# Patient Record
Sex: Female | Born: 1965
Health system: Southern US, Community
[De-identification: ages and names within clinical notes are randomized; demographics above are authoritative.]

## PROBLEM LIST (undated history)

## (undated) DIAGNOSIS — I517 Cardiomegaly: Secondary | ICD-10-CM

## (undated) DIAGNOSIS — Z9289 Personal history of other medical treatment: Secondary | ICD-10-CM

## (undated) DIAGNOSIS — I10 Essential (primary) hypertension: Secondary | ICD-10-CM

## (undated) DIAGNOSIS — J398 Other specified diseases of upper respiratory tract: Secondary | ICD-10-CM

## (undated) DIAGNOSIS — D649 Anemia, unspecified: Secondary | ICD-10-CM

## (undated) HISTORY — DX: Cardiomegaly: I51.7

## (undated) HISTORY — PX: TRACHEOSTOMY: SUR1362

## (undated) HISTORY — DX: Personal history of other medical treatment: Z92.89

## (undated) HISTORY — DX: Essential (primary) hypertension: I10

## (undated) HISTORY — DX: Anemia, unspecified: D64.9

## (undated) HISTORY — DX: Other specified diseases of upper respiratory tract: J39.8

## (undated) HISTORY — DX: Morbid (severe) obesity due to excess calories: E66.01

---

## 2005-10-27 ENCOUNTER — Other Ambulatory Visit: Admission: RE | Admit: 2005-10-27 | Discharge: 2005-10-27 | Payer: Self-pay | Admitting: Family Medicine

## 2006-03-14 ENCOUNTER — Emergency Department (HOSPITAL_COMMUNITY): Admission: EM | Admit: 2006-03-14 | Discharge: 2006-03-15 | Payer: Self-pay | Admitting: Emergency Medicine

## 2006-03-16 ENCOUNTER — Ambulatory Visit (HOSPITAL_COMMUNITY): Admission: RE | Admit: 2006-03-16 | Discharge: 2006-03-16 | Payer: Self-pay | Admitting: Emergency Medicine

## 2007-07-02 LAB — HM COLONOSCOPY: HM Colonoscopy: NORMAL

## 2008-06-30 LAB — CONVERTED CEMR LAB: Pap Smear: NORMAL

## 2009-11-17 LAB — CONVERTED CEMR LAB: Pap Smear: NORMAL

## 2009-11-18 ENCOUNTER — Emergency Department (HOSPITAL_COMMUNITY): Admission: EM | Admit: 2009-11-18 | Discharge: 2009-11-19 | Payer: Self-pay | Admitting: Emergency Medicine

## 2009-11-19 ENCOUNTER — Encounter (INDEPENDENT_AMBULATORY_CARE_PROVIDER_SITE_OTHER): Payer: Self-pay | Admitting: *Deleted

## 2009-11-25 ENCOUNTER — Encounter (INDEPENDENT_AMBULATORY_CARE_PROVIDER_SITE_OTHER): Payer: Self-pay | Admitting: *Deleted

## 2009-11-25 ENCOUNTER — Emergency Department (HOSPITAL_COMMUNITY): Admission: EM | Admit: 2009-11-25 | Discharge: 2009-11-25 | Payer: Self-pay | Admitting: Emergency Medicine

## 2009-11-29 ENCOUNTER — Ambulatory Visit: Payer: Self-pay | Admitting: Family

## 2009-11-29 DIAGNOSIS — I10 Essential (primary) hypertension: Secondary | ICD-10-CM | POA: Insufficient documentation

## 2009-11-29 DIAGNOSIS — M545 Low back pain, unspecified: Secondary | ICD-10-CM | POA: Insufficient documentation

## 2009-11-29 LAB — CONVERTED CEMR LAB
CO2: 27 meq/L (ref 19–32)
Calcium: 9.7 mg/dL (ref 8.4–10.5)
Cholesterol: 135 mg/dL (ref 0–200)
Creatinine, Ser: 1.01 mg/dL (ref 0.40–1.20)
HCT: 37.4 % (ref 36.0–46.0)
MCHC: 31.6 g/dL (ref 30.0–36.0)
Platelets: 343 10*3/uL (ref 150–400)
RBC: 5.18 M/uL — ABNORMAL HIGH (ref 3.87–5.11)
Sodium: 141 meq/L (ref 135–145)
Total CHOL/HDL Ratio: 3.1
VLDL: 17 mg/dL (ref 0–40)

## 2009-11-30 ENCOUNTER — Telehealth: Payer: Self-pay | Admitting: Family

## 2009-11-30 ENCOUNTER — Telehealth (INDEPENDENT_AMBULATORY_CARE_PROVIDER_SITE_OTHER): Payer: Self-pay | Admitting: *Deleted

## 2009-11-30 ENCOUNTER — Encounter: Payer: Self-pay | Admitting: Family

## 2009-11-30 DIAGNOSIS — D508 Other iron deficiency anemias: Secondary | ICD-10-CM | POA: Insufficient documentation

## 2009-12-01 ENCOUNTER — Encounter (INDEPENDENT_AMBULATORY_CARE_PROVIDER_SITE_OTHER): Payer: Self-pay | Admitting: *Deleted

## 2009-12-07 ENCOUNTER — Ambulatory Visit (HOSPITAL_BASED_OUTPATIENT_CLINIC_OR_DEPARTMENT_OTHER): Admission: RE | Admit: 2009-12-07 | Discharge: 2009-12-07 | Payer: Self-pay | Admitting: Internal Medicine

## 2009-12-07 ENCOUNTER — Ambulatory Visit: Payer: Self-pay | Admitting: Radiology

## 2009-12-09 ENCOUNTER — Ambulatory Visit: Payer: Self-pay | Admitting: Family

## 2009-12-12 ENCOUNTER — Telehealth (INDEPENDENT_AMBULATORY_CARE_PROVIDER_SITE_OTHER): Payer: Self-pay | Admitting: *Deleted

## 2009-12-14 ENCOUNTER — Encounter: Admission: RE | Admit: 2009-12-14 | Discharge: 2009-12-17 | Payer: Self-pay | Admitting: Internal Medicine

## 2009-12-14 ENCOUNTER — Encounter (INDEPENDENT_AMBULATORY_CARE_PROVIDER_SITE_OTHER): Payer: Self-pay | Admitting: *Deleted

## 2009-12-16 ENCOUNTER — Ambulatory Visit: Payer: Self-pay | Admitting: Family

## 2009-12-17 ENCOUNTER — Encounter: Payer: Self-pay | Admitting: Family

## 2009-12-20 ENCOUNTER — Encounter: Admission: RE | Admit: 2009-12-20 | Discharge: 2010-01-07 | Payer: Self-pay | Admitting: Internal Medicine

## 2009-12-24 ENCOUNTER — Ambulatory Visit: Payer: Self-pay | Admitting: Family

## 2010-01-11 ENCOUNTER — Ambulatory Visit: Payer: Self-pay | Admitting: Family

## 2010-01-26 ENCOUNTER — Ambulatory Visit: Payer: Self-pay | Admitting: Family

## 2010-01-26 ENCOUNTER — Telehealth: Payer: Self-pay | Admitting: Family

## 2010-01-26 LAB — CONVERTED CEMR LAB
BUN: 12 mg/dL (ref 6–23)
Basophils Absolute: 0 10*3/uL (ref 0.0–0.1)
CO2: 28 meq/L (ref 19–32)
Calcium: 8.7 mg/dL (ref 8.4–10.5)
Creatinine, Ser: 0.9 mg/dL (ref 0.4–1.2)
Glucose, Bld: 98 mg/dL (ref 70–99)
Hemoglobin: 9.8 g/dL — ABNORMAL LOW (ref 12.0–15.0)
Monocytes Absolute: 0.5 10*3/uL (ref 0.1–1.0)
Monocytes Relative: 5.9 % (ref 3.0–12.0)
Neutro Abs: 6.9 10*3/uL (ref 1.4–7.7)
Potassium: 3.5 meq/L (ref 3.5–5.1)
RDW: 18 % — ABNORMAL HIGH (ref 11.5–14.6)

## 2010-01-30 ENCOUNTER — Encounter: Payer: Self-pay | Admitting: Family

## 2010-01-31 ENCOUNTER — Encounter (INDEPENDENT_AMBULATORY_CARE_PROVIDER_SITE_OTHER): Payer: Self-pay | Admitting: *Deleted

## 2010-02-07 ENCOUNTER — Encounter: Payer: Self-pay | Admitting: Internal Medicine

## 2010-02-09 ENCOUNTER — Ambulatory Visit: Payer: Self-pay | Admitting: Family

## 2010-02-09 LAB — CONVERTED CEMR LAB
Basophils Absolute: 0.1 10*3/uL (ref 0.0–0.1)
Basophils Relative: 0.5 % (ref 0.0–3.0)
Eosinophils Absolute: 0.2 10*3/uL (ref 0.0–0.7)
HCT: 33.2 % — ABNORMAL LOW (ref 36.0–46.0)
Lymphs Abs: 1.8 10*3/uL (ref 0.7–4.0)
MCV: 74.6 fL — ABNORMAL LOW (ref 78.0–100.0)
Monocytes Absolute: 0.5 10*3/uL (ref 0.1–1.0)
Neutro Abs: 7.6 10*3/uL (ref 1.4–7.7)
Platelets: 258 10*3/uL (ref 150.0–400.0)
RBC: 4.45 M/uL (ref 3.87–5.11)
RDW: 18.2 % — ABNORMAL HIGH (ref 11.5–14.6)

## 2010-04-06 ENCOUNTER — Telehealth: Payer: Self-pay | Admitting: Family

## 2010-04-15 ENCOUNTER — Telehealth: Payer: Self-pay | Admitting: Family

## 2010-04-20 ENCOUNTER — Encounter: Payer: Self-pay | Admitting: Family

## 2010-04-20 ENCOUNTER — Telehealth: Payer: Self-pay | Admitting: Family

## 2010-07-27 ENCOUNTER — Ambulatory Visit: Payer: Self-pay | Admitting: Family Medicine

## 2010-07-28 LAB — CONVERTED CEMR LAB
BUN: 12 mg/dL (ref 6–23)
Basophils Relative: 0.4 % (ref 0.0–3.0)
Calcium: 9.3 mg/dL (ref 8.4–10.5)
Eosinophils Absolute: 0.2 10*3/uL (ref 0.0–0.7)
GFR calc non Af Amer: 77.09 mL/min (ref >60)
HCT: 35.2 % — ABNORMAL LOW (ref 36.0–46.0)
Lymphocytes Relative: 25.8 % (ref 12.0–46.0)
Lymphs Abs: 2.3 10*3/uL (ref 0.7–4.0)
MCV: 76.6 fL — ABNORMAL LOW (ref 78.0–100.0)
Monocytes Relative: 5.9 % (ref 3.0–12.0)
Platelets: 286 10*3/uL (ref 150.0–400.0)
RBC: 4.6 M/uL (ref 3.87–5.11)
Sodium: 144 meq/L (ref 135–145)

## 2010-08-17 ENCOUNTER — Ambulatory Visit: Payer: Self-pay | Admitting: Family Medicine

## 2010-11-30 ENCOUNTER — Ambulatory Visit: Payer: Self-pay | Admitting: Family Medicine

## 2010-11-30 ENCOUNTER — Encounter: Payer: Self-pay | Admitting: Family Medicine

## 2010-12-01 ENCOUNTER — Telehealth (INDEPENDENT_AMBULATORY_CARE_PROVIDER_SITE_OTHER): Payer: Self-pay | Admitting: *Deleted

## 2010-12-01 LAB — CONVERTED CEMR LAB
AST: 24 units/L (ref 0–37)
Albumin: 3.3 g/dL — ABNORMAL LOW (ref 3.5–5.2)
Basophils Absolute: 0 10*3/uL (ref 0.0–0.1)
Eosinophils Relative: 1 % (ref 0.0–5.0)
HCT: 33.5 % — ABNORMAL LOW (ref 36.0–46.0)
HIV: NONREACTIVE
Hemoglobin: 11 g/dL — ABNORMAL LOW (ref 12.0–15.0)
Lymphocytes Relative: 14.7 % (ref 12.0–46.0)
MCHC: 32.8 g/dL (ref 30.0–36.0)
Monocytes Relative: 4.8 % (ref 3.0–12.0)
Neutrophils Relative %: 79.2 % — ABNORMAL HIGH (ref 43.0–77.0)
Platelets: 255 10*3/uL (ref 150.0–400.0)
Potassium: 4 meq/L (ref 3.5–5.1)
RBC: 4.41 M/uL (ref 3.87–5.11)
RDW: 18.1 % — ABNORMAL HIGH (ref 11.5–14.6)
Total Bilirubin: 0.6 mg/dL (ref 0.3–1.2)
Total CHOL/HDL Ratio: 3
Total Protein: 6 g/dL (ref 6.0–8.3)
Vit D, 25-Hydroxy: 19 ng/mL — ABNORMAL LOW (ref 30–89)
WBC: 10.6 10*3/uL — ABNORMAL HIGH (ref 4.5–10.5)

## 2011-01-17 NOTE — Assessment & Plan Note (Signed)
Summary: 2wk. f/u - jr   Vital Signs:  Patient profile:   45 year old female Menstrual status:  regular Weight:      353.13 pounds BMI:     49.78 Pulse rate:   80 / minute Pulse rhythm:   regular Resp:     16 per minute BP sitting:   110 / 80  (left arm) Cuff size:   regular CC: room 17  2 week follow up on blood pressure and labs Is Patient Diabetic? No Comments Pt unsure why she was given rx for protonix last visit. Pt has only been taking iron supp. once daily.   CC:  room 17  2 week follow up on blood pressure and labs.  History of Present Illness: [name] presents today for follow up of her anemia and her blood pressure.  She was recently noted to have worsening anemia and was referred to [facility]- she has an upcoming appointment on 3/22 with GI.  She has a history of low back pain and was being treated with NSAIDS.   Due to concern that she may also be having a GI loss in addition to her chronic heavy menses her mobic was discontinued and she was prescribed protonix for GI prophylaxis.  Unfortunately she did not take protonix as prescribed.   Denies black stools, tarry stools or BRBPR. Does feel tired.  She has only been taking iron once daily, however she has previously been instructed to take 3x daily. Last period was 2/9 and lasted 7 days.  Notes that first few days are "really heavy"  Wears pads and super tampons- tampon often bleeds through to her pad.  She has seen GYN-  she is scheduled to see GYN on Monday and she is to have an ultrasound performed that day.    Preventive Screening-Counseling & Management  Alcohol-Tobacco     Smoking Status: never  Allergies (verified): No Known Drug Allergies  Physical Exam  General:  morbidly obese female in NAD Lungs:  Normal respiratory effort, chest expands symmetrically. Lungs are clear to auscultation, no crackles or wheezes. Heart:  Normal rate and regular rhythm. S1 and S2 normal without gallop, murmur, click, rub or  other extra sounds. Extremities:  No clubbing, cyanosis, edema, or deformity noted with normal full range of motion of all joints.     Impression & Recommendations:  Problem # 1:  ANEMIA, IRON DEFICIENCY, MICROCYTIC (ICD-280.8) Assessment Improved Hemoglobin is up from 9.8 to 10.3.  Plan for patient to continue iron on a three times a day schedule, f/u with GYN and GI as scheduled.   Her updated medication list for this problem includes:    Iron 325 (65 Fe) Mg Tabs (Ferrous sulfate) ..... One tablet by mouth three times a day prn  Orders: Venipuncture (09811) TLB-CBC Platelet - w/Differential (85025-CBCD)  Complete Medication List: 1)  Robaxin 500 Mg Tabs (Methocarbamol) .Marland Kitchen.. 1 tab by mouth hs 2)  Hydrochlorothiazide 25 Mg Tabs (Hydrochlorothiazide) .... 1/2  tablet by mouth daily 3)  Iron 325 (65 Fe) Mg Tabs (Ferrous sulfate) .... One tablet by mouth three times a day prn 4)  Labetalol Hcl 300 Mg Tabs (Labetalol hcl) .... One tab by mouth two times a day 5)  Protonix 40 Mg Tbec (Pantoprazole sodium) .... One tablet by mouth daily  Patient Instructions: 1)  Please take protonix daily and increase your iron to 3x daily. 2)  Keep appointments with GYN and GI. 3)  Call if increasing weakness,  bloody or black stools.    Current Allergies (reviewed today): No known allergies    Immunization History:  Influenza Immunization History:    Influenza:  historical (01/19/2009)    Preventive Care Screening  Mammogram:    Date:  11/17/2009    Results:  normal   Pap Smear:    Date:  11/17/2009    Results:  normal

## 2011-01-17 NOTE — Progress Notes (Signed)
Summary: labs-lmomx2  Phone Note Outgoing Call   Call placed by: Lemont Fillers FNP,  January 26, 2010 5:07 PM Summary of Call: Called patient, left message to call back regarding lab work.  When patient calls back please verify that she is taking iron supplement three times a day and that she has had an appointment with GYN to evaluate heavy menstrual bleeding.  She needs a follow up CBC in 1 month please (280.8). Initial call taken by: Lemont Fillers FNP,  January 26, 2010 5:09 PM  Follow-up for Phone Call        Called patient again, no answer.  Left message for patient to call back.  Also instructed patient to d/c mobic and switch to Tylenol.  I am concerned that her worsening anemia may be due to GI loss.  Will also refer to GI for further evaluation.    left message on machine ....Marland KitchenMarland KitchenDoristine Devoid  January 31, 2010 4:31 PM   left message on machine .Marland KitchenMarland KitchenDoristine Devoid  February 01, 2010 4:15 PM     Additional Follow-up for Phone Call Additional follow up Details #1::        Called patient.  Tells me that she has not had any dark or black colored stools.  + menses (heavy). Has seen GYN, they want to do an ultrasound, but this has been delayed due to menses.  I advised patient to discontinue mobic and use tylenol as needed.  Also advised patient to start protonix.  Will refer to GI.  Patient is agreeable, she has f/u with me on 2/23, will plan to repeat CBC at that time.  She reports taking iron two times a day, I recommended three times a day.  Additional Follow-up by: Lemont Fillers FNP,  February 01, 2010 5:51 PM

## 2011-01-17 NOTE — Letter (Signed)
Summary: New Patient letter  [facility]  [address] [place], [place] [address]   Phone: [phone]  Fax: [phone]       01/31/2010 MRN: [id number]  Margaret Barron [address] [address] [place], [place]  [address]  Dear Ms. Margaret Barron,  Welcome to the Gastroenterology Division at [facility].    You are scheduled to see Dr.  Christella Hartigan on 03-08-10 at 8:30a.m. on the 3rd floor at [facility], [address]  We ask that you try to arrive at our office 15 minutes prior to your appointment time to allow for check-in.  We would like you to complete the enclosed self-administered evaluation form prior to your visit and bring it with you on the day of your appointment.  We will review it with you.  Also, please bring a complete list of all your medications or, if you prefer, bring the medication bottles and we will list them.  Please bring your insurance card so that we may make a copy of it.  If your insurance requires a referral to see a specialist, please bring your referral form from your primary care physician.  Co-payments are due at the time of your visit and may be paid by cash, check or credit card.     Your office visit will consist of a consult with your physician (includes a physical exam), any laboratory testing he/she may order, scheduling of any necessary diagnostic testing (e.g. x-ray, ultrasound, CT-scan), and scheduling of a procedure (e.g. Endoscopy, Colonoscopy) if required.  Please allow enough time on your schedule to allow for any/all of these possibilities.    If you cannot keep your appointment, please call [phone] to cancel or reschedule prior to your appointment date.  This allows [place] the opportunity to schedule an appointment for another patient in need of care.  If you do not cancel or reschedule by 5 p.m. the business day prior to your appointment date, you will be charged a $50.00 late cancellation/no-show fee.    Thank you for choosing [facility] for your medical needs.  We appreciate the opportunity to care for you.  Please visit [place] at our website  to learn more about our practice.                     Sincerely,                                                             The Gastroenterology Division

## 2011-01-17 NOTE — Letter (Signed)
Summary: Generic Letter  [place] at [facility]  [address]   [place], [place] [address]   Phone: [phone]  Fax: [phone]    04/20/2010  [name] [address] [address] [place], [place]  [address]  Dear Mr [name],  Margaret Barron is a patient of mine who has a history of low back pain.  It would be helpful for her if accomodations could be made that would allow her to stand periodically during the day.    Sincerely,   Sandford Craze FNP  Appended Document: Generic Letter Letter mailed to pt.

## 2011-01-17 NOTE — Progress Notes (Signed)
Summary: request for adjustable height desk  Phone Note Call from Patient   Caller: Patient Call For: Margaret Fillers FNP Summary of Call: Pt states that even with the adjustable height chair she has to stand to relieve her back pain. When she stands she has to bend over her desk to work which creates more back pain.  She would like an adjustable height desk so she can still work when she has to stand. Mervin Kung CMA  April 06, 2010 5:38 PM   Follow-up for Phone Call        Per verbal conversation with Efraim Kaufmann, she will contact physical therapist for recommendations and we will let pt know of the outcome.  Mervin Kung CMA  April 06, 2010 5:41 PM   Left message on machine to return call.  Mervin Kung CMA  April 07, 2010 12:05 PM   Advised pt. Efraim Kaufmann is working on this and will consult with PT for recommendations. Advised pt. we will contact her with instruction or outcome.  Mervin Kung CMA  April 11, 2010 11:34 AM   Additional Follow-up for Phone Call Additional follow up Details #1::        Pls call patient and let her know that I spoke with [name] her PTA.  She has recommended a work place assessment to be done by a PT specialist.  They will come to her work and evaluate her equiptment needs and make recommendations.  Additional Follow-up by: Margaret Fillers FNP,  April 11, 2010 3:16 PM    f  Appended Document: request for adjustable height desk Spoke to pt. yesterday and explained that Efraim Kaufmann is referring her back to PT for work site evaluation to determine specific equipment to benefit her condition.  Pt voiced understanding and is agreeable. Notified Kisha at [facility] PT to contact pt again.

## 2011-01-17 NOTE — Assessment & Plan Note (Signed)
Summary: 2 week fu/ns/kdc   Vital Signs:  Patient profile:   45 year old female Menstrual status:  regular Weight:      349 pounds Pulse rate:   68 / minute BP sitting:   140 / 80  (left arm)  Vitals Entered By: Doristine Devoid (January 26, 2010 9:23 AM) CC: 2 week roa    CC:  2 week roa .  History of Present Illness: Margaret Barron is a 45 year old female who presents today for follow up of her HTN.  Last visit her Labetolol was increased for 100mg  by mouth two times a day to 200mg  by mouth two times a day.  Notes that she feels well on this dose.  She continues to take 12.5 mg of the HCTZ.    Allergies: No Known Drug Allergies  Review of Systems       denies swelling, headache or dizziness  Physical Exam  General:  morbidly obese female in NAD Neck:  former tracheostomy scar Lungs:  Normal respiratory effort, chest expands symmetrically. Lungs are clear to auscultation, no crackles or wheezes. Heart:  Normal rate and regular rhythm. S1 and S2 normal without gallop, murmur, click, rub or other extra sounds. Extremities:  no edema   Impression & Recommendations:  Problem # 1:  HYPERTENSION (ICD-401.9) Assessment Improved BP continues to improve,  will check BMET to assess electrolytes on HCTZ. Will increase Labetalol from 200mg  to 300mg  by mouth two times a day, plan f/u nurse visit for BP and HR check in 2 weeks.   Her updated medication list for this problem includes:    Hydrochlorothiazide 25 Mg Tabs (Hydrochlorothiazide) .Marland Kitchen... 1/2  tablet by mouth daily    Labetalol Hcl 300 Mg Tabs (Labetalol hcl) ..... One tab by mouth two times a day  BP today: 140/80 Prior BP: 150/90 (01/11/2010)  Labs Reviewed: K+: 4.2 (11/29/2009) Creat: : 1.01 (11/29/2009)   Chol: 135 (11/29/2009)   HDL: 43 (11/29/2009)   LDL: 75 (11/29/2009)   TG: 85 (11/29/2009)  Orders: Venipuncture (08657) TLB-BMP (Basic Metabolic Panel-BMET) (80048-METABOL)  Problem # 2:  LOW BACK PAIN, ACUTE  (ICD-724.2) Assessment: Improved Notes that overal pain is improving, Still requiring as needed robaxin and mobic and is requesting refill. Her updated medication list for this problem includes:    Robaxin 500 Mg Tabs (Methocarbamol) .Marland Kitchen... 1 tab by mouth hs    Mobic 7.5 Mg Tabs (Meloxicam) .Marland Kitchen..Marland Kitchen Two times a day by mouth daily  Problem # 3:  ANEMIA, IRON DEFICIENCY, MICROCYTIC (ICD-280.8) Assessment: Comment Only Has been on iron x 1 month.  Will check f/u CBC to evaluate improvement today. Her updated medication list for this problem includes:    Iron 325 (65 Fe) Mg Tabs (Ferrous sulfate) ..... One tablet by mouth three times a day prn  Orders: TLB-CBC Platelet - w/Differential (85025-CBCD)  Complete Medication List: 1)  Robaxin 500 Mg Tabs (Methocarbamol) .Marland Kitchen.. 1 tab by mouth hs 2)  Mobic 7.5 Mg Tabs (Meloxicam) .... Two times a day by mouth daily 3)  Hydrochlorothiazide 25 Mg Tabs (Hydrochlorothiazide) .... 1/2  tablet by mouth daily 4)  Iron 325 (65 Fe) Mg Tabs (Ferrous sulfate) .... One tablet by mouth three times a day prn 5)  Labetalol Hcl 300 Mg Tabs (Labetalol hcl) .... One tab by mouth two times a day  Patient Instructions: 1)  Please complete blood work prior to leaving today. 2)  Follow up in 2 weeks for a nurse visit to  have your heart rate and blood pressure checked. Prescriptions: ROBAXIN 500 MG TABS (METHOCARBAMOL) 1 tab by mouth hs  #30 x 1   Entered and Authorized by:   Lemont Fillers FNP   Signed by:   Lemont Fillers FNP on 01/26/2010   Method used:   Electronically to        [facility] [address] (retail)       [address]       [place], [place]  [address]       Ph: [phone]       Fax: [phone]   RxID:   [id number] MOBIC 7.5 MG TABS (MELOXICAM) two times a day by mouth daily  #30 x 1   Entered and Authorized by:   Lemont Fillers FNP   Signed by:   Lemont Fillers FNP on 01/26/2010   Method used:    Electronically to        [facility] [address] [facility] (retail)       [address]       [place], [place]  [address]       Ph: [phone]       Fax: [phone]   RxID:   [id number] LABETALOL HCL 300 MG TABS (LABETALOL HCL) one tab by mouth two times a day  #60 x 1   Entered and Authorized by:   Lemont Fillers FNP   Signed by:   Lemont Fillers FNP on 01/26/2010   Method used:   Electronically to        [facility] [address] (retail)       [address]       [place], [place]  [address]       Ph: [phone]       Fax: [phone]   RxID:   [id number]

## 2011-01-17 NOTE — Progress Notes (Signed)
Summary: [facility] refills--hctz, labetalol, robaxin  Phone Note Refill Request Message from:  Fax from Pharmacy [facility] on April 15, 2010 11:45 AM  Refills Requested: Medication #1:  HYDROCHLOROTHIAZIDE 25 MG TABS 1/2  tablet by mouth daily   Dosage confirmed as above?Dosage Confirmed   Supply Requested: 3 months  Medication #2:  LABETALOL HCL 300 MG TABS one tab by mouth two times a day   Dosage confirmed as above?Dosage Confirmed   Supply Requested: 3 months  Medication #3:  ROBAXIN 500 MG TABS 1 tab by mouth hs   Dosage confirmed as above?Dosage Confirmed   Supply Requested: 3 months Pt. last seen 03/08/10. Requesting 3 months supply on all meds. Meloxicam was removed from her med list previously. Please advise?  Initial call taken by: Mervin Kung CMA,  April 15, 2010 11:48 AM  Follow-up for Phone Call        OK to give 1 month supply of HCTZ, Labetalol, and robaxin.  No meloxicam- this was stopped due to anemia.  Please call pt and arrange f/u apt.  Also, please ask why she did not see GI.  This apt should be rescheduled also.  Thanks. Follow-up by: Lemont Fillers FNP,  April 15, 2010 2:34 PM  Additional Follow-up for Phone Call Additional follow up Details #1::        Advised pt. of Locke Barrell's instructions.  Pt voices understanding.  Pt. states she did go for initial GI evaluation but did not schedule a follow up. She states she will call them to arrange appt.  Also states she will have to call me back to make appt. with Caisen Mangas after she checks her schedule.  Mervin Kung CMA  April 15, 2010 3:18 PM     Prescriptions: LABETALOL HCL 300 MG TABS (LABETALOL HCL) one tab by mouth two times a day  #60 x 0   Entered by:   Mervin Kung CMA   Authorized by:   Lemont Fillers FNP   Signed by:   Mervin Kung CMA on 04/15/2010   Method used:   Electronically to        [facility] (mail-order)             ,          Ph: [phone]       Fax:  [phone]   RxID:   [id number] HYDROCHLOROTHIAZIDE 25 MG TABS (HYDROCHLOROTHIAZIDE) 1/2  tablet by mouth daily  #15 x 0   Entered by:   Mervin Kung CMA   Authorized by:   Lemont Fillers FNP   Signed by:   Mervin Kung CMA on 04/15/2010   Method used:   Electronically to        [facility] (mail-order)             ,          Ph: [phone]       Fax: [phone]   RxID:   [id number] ROBAXIN 500 MG TABS (METHOCARBAMOL) 1 tab by mouth hs  #30 x 0   Entered by:   Mervin Kung CMA   Authorized by:   Lemont Fillers FNP   Signed by:   Mervin Kung CMA on 04/15/2010   Method used:   Electronically to        [facility] (mail-order)             ,          Ph: [phone]  Fax: [phone]   RxID:   [id number]

## 2011-01-17 NOTE — Letter (Signed)
Summary: Generic Letter  [place] [facility] [place]  [address] [place], [place] [address]   Phone: [phone]  Fax: [phone]    12/24/2009  JOELLYN GRANDT [address] [address] [place], [place]  [address]  Dear Ms. ALLSHOUSE,  Ms Asher has been under our care for back pain,  she is medically cleared to return to work on 01/03/10 without restriction.    Sincerely,   Sandford Craze FNP

## 2011-01-17 NOTE — Assessment & Plan Note (Signed)
Summary: 2 WEEK FOLLOWUP///SPH   Vital Signs:  Patient profile:   45 year old female Menstrual status:  regular Weight:      347.38 pounds Pulse rate:   70 / minute BP sitting:   150 / 90  Vitals Entered By: Kandice Hams (January 11, 2010 10:38 AM) CC: 2 week followup BP   CC:  2 week followup BP.  History of Present Illness: [name] is a 45 year old female who presents today for follow up of her blood pressure.  Notes that she has been tolerating this new medicine without difficulty.  Her medications have recently been changed due to patient's consideration of pregnancy and we are continuing to titrate her meds.    Low back pain- notes that her back pain is improving with physical therapy.  She continues to use the mobic as needed.    Allergies: No Known Drug Allergies  Review of Systems       Denies swelling, denies HA's  Physical Exam  General:  morbidly obese female in NAD Head:  Normocephalic and atraumatic without obvious abnormalities. No apparent alopecia or balding. Extremities:  No clubbing, cyanosis, edema Psych:  Cognition and judgment appear intact. Alert and cooperative with normal attention span and concentration. No apparent delusions, illusions, hallucinations   Impression & Recommendations:  Problem # 1:  HYPERTENSION (ICD-401.9) Assessment Unchanged Plan to increase labetalol from 100mg  by mouth two times a day to 200mg  by mouth two times a day.  f/u in 2 weeks for BP and HR check.   Her updated medication list for this problem includes:    Hydrochlorothiazide 25 Mg Tabs (Hydrochlorothiazide) .Marland Kitchen... 1/2  tablet by mouth daily    Labetalol Hcl 200 Mg Tabs (Labetalol hcl) ..... One tablet by mouth two times a day  BP today: 150/90 Prior BP: 160/80 (12/24/2009)  Labs Reviewed: K+: 4.2 (11/29/2009) Creat: : 1.01 (11/29/2009)   Chol: 135 (11/29/2009)   HDL: 43 (11/29/2009)   LDL: 75 (11/29/2009)   TG: 85 (11/29/2009)  Problem # 2:  LOW BACK PAIN,  ACUTE (ICD-724.2) Assessment: Improved Continue PT and current meds Her updated medication list for this problem includes:    Robaxin 500 Mg Tabs (Methocarbamol) .Marland Kitchen... 1 tab by mouth hs    Mobic 7.5 Mg Tabs (Meloxicam) .Marland Kitchen..Marland Kitchen Two times a day by mouth daily  Complete Medication List: 1)  Robaxin 500 Mg Tabs (Methocarbamol) .Marland Kitchen.. 1 tab by mouth hs 2)  Mobic 7.5 Mg Tabs (Meloxicam) .... Two times a day by mouth daily 3)  Hydrochlorothiazide 25 Mg Tabs (Hydrochlorothiazide) .... 1/2  tablet by mouth daily 4)  Iron 325 (65 Fe) Mg Tabs (Ferrous sulfate) .... One tablet by mouth three times a day prn 5)  Labetalol Hcl 200 Mg Tabs (Labetalol hcl) .... One tablet by mouth two times a day  Patient Instructions: 1)  Please follow up for a nurse visit in 2 weeks for a BP check. 2)  Continue a low sodium diet.  Prescriptions: LABETALOL HCL 200 MG TABS (LABETALOL HCL) one tablet by mouth two times a day  #60 x 0   Entered and Authorized by:   Lemont Fillers FNP   Signed by:   Lemont Fillers FNP on 01/11/2010   Method used:   Electronically to        [facility] [address] (retail)       [address]       [place], [place]  [id number]  Ph: [phone]       Fax: [phone]   RxID:   [id number]   Appended Document: 2 WEEK FOLLOWUP///SPH This is a striking anemia for menstrual blood loss only @ 43. She needs aggressive workup if not done to R/O any GI component  because of multiple comorbidities ( HTN, marked anemia, morbid obesity) in 45 yo contemplating pregnancy. Such a pregnancy  would be very high risk; I question "cognition & judgement appear intact". There is lack of insight  & reality testing suggested.

## 2011-01-17 NOTE — Letter (Signed)
   [place] [facility] [address] [place], [place]  [address]  [place] Phone: [phone]      January 30, 2010   [facility] [address] [address] [place], [place] [address]  RE:  LAB RESULTS  Dear  Ms. Budde,  The following is an interpretation of your most recent lab tests.  Please take note of any instructions provided or changes to medications that have resulted from your lab work.   CBC:  Abnormal - schedule a follow-up appointment  Please stop mobic and start protonix, I have sent it to your pharmacy.  I would like to refer you to Gastroenterology.  Please call my office at your earliest convenience to discuss.   Medications Prescribed or Changed PROTONIX 40 MG TBEC (PANTOPRAZOLE SODIUM) one tablet by mouth daily   Medications Discontinued  MOBIC 7.5 MG TABS (MELOXICAM) two times a day by mouth daily   Sincerely Yours,    Lemont Fillers FNP

## 2011-01-17 NOTE — Assessment & Plan Note (Signed)
Summary: 2WK FOLLOW UP/RH.....   Vital Signs:  Patient profile:   45 year old female Menstrual status:  regular Weight:      344.2 pounds Pulse rate:   88 / minute BP sitting:   160 / 80  (left arm)  Vitals Entered By: Doristine Devoid (December 24, 2009 10:47 AM) CC: 2 WEEK ROA    CC:  2 WEEK ROA .  History of Present Illness: Ms Scheerer present for follow up of her blood pressure and back pain.  Notes that her back pain symptoms are improving with physical therapy, wants to return to work.  Still using mobic on a daily basis and robaxin as needed.  Allergies: No Known Drug Allergies  Review of Systems       improvement in Low back pain, denies HA   Impression & Recommendations:  Problem # 1:  LOW BACK PAIN, ACUTE (ICD-724.2) Assessment Improved Plan to return to work 1/17- is noticing improvement with PT- continue.  Still requiring Mobic and robaxin as needed. Form filled for patient to return to work. Her updated medication list for this problem includes:    Robaxin 500 Mg Tabs (Methocarbamol) .Marland Kitchen... 1 tab by mouth hs    Mobic 7.5 Mg Tabs (Meloxicam) .Marland Kitchen..Marland Kitchen Two times a day by mouth daily  Problem # 2:  HYPERTENSION (ICD-401.9) Assessment: Improved Still not at goal.  Discussed med regimen with Dr. Artist Pais in setting of possible upcoming pregnancy, he recommends labetolol in place of Norvasc.  Will change- I do suspect that patient will ultimately require more than 2 agent to control her BP.  Start labetolol 100mg  two times a day and plan to titrate upward as needed.  Pt notes that she plans to start weight watchers soon. The following medications were removed from the medication list:    Norvasc 10 Mg Tabs (Amlodipine besylate) ..... One tablet by mouth daily Her updated medication list for this problem includes:    Hydrochlorothiazide 25 Mg Tabs (Hydrochlorothiazide) .Marland Kitchen... 1/2  tablet by mouth daily    Labetalol Hcl 100 Mg Tabs (Labetalol hcl) ..... One tablet by mouth by mouth  bid  BP today: 160/80 Prior BP: 180/96 (12/09/2009)  Labs Reviewed: K+: 4.2 (11/29/2009) Creat: : 1.01 (11/29/2009)   Chol: 135 (11/29/2009)   HDL: 43 (11/29/2009)   LDL: 75 (11/29/2009)   TG: 85 (11/29/2009)  Problem # 3:  ANEMIA, IRON DEFICIENCY, MICROCYTIC (ICD-280.8) now on Iron- has appointment with GYN to evaluate menorrhagia today Her updated medication list for this problem includes:    Iron 325 (65 Fe) Mg Tabs (Ferrous sulfate) ..... One tablet by mouth three times a day  Complete Medication List: 1)  Robaxin 500 Mg Tabs (Methocarbamol) .Marland Kitchen.. 1 tab by mouth hs 2)  Mobic 7.5 Mg Tabs (Meloxicam) .... Two times a day by mouth daily 3)  Hydrochlorothiazide 25 Mg Tabs (Hydrochlorothiazide) .... 1/2  tablet by mouth daily 4)  Iron 325 (65 Fe) Mg Tabs (Ferrous sulfate) .... One tablet by mouth three times a day 5)  Labetalol Hcl 100 Mg Tabs (Labetalol hcl) .... One tablet by mouth by mouth bid  Patient Instructions: 1)  Please schedule a follow-up appointment in 2 weeks. 2)  Discontinue norvasc, start labetolol. Prescriptions: LABETALOL HCL 100 MG TABS (LABETALOL HCL) one tablet by mouth by mouth BID  #60 x 0   Entered and Authorized by:   Lemont Fillers FNP   Signed by:   Lemont Fillers FNP on 12/24/2009  Method used:   Electronically to        [facility] (retail)       [address]       [place], [place]  [id number]       Ph: [phone]       Fax: [phone]   RxID:   [id number]   Appended Document: 2WK FOLLOW UP/RH..... Exam  GEN:  Awake, alert morbidly obese female, NAD MS: stength equal in bilateral lower extremities, neg straight leg test EXT: no edema noted

## 2011-01-17 NOTE — Assessment & Plan Note (Signed)
Summary: f/u bp and needs refill/nta   Vital Signs:  Patient profile:   45 year old female Menstrual status:  regular Weight:      335 pounds Pulse rate:   90 / minute BP sitting:   140 / 98  (left arm)  Vitals Entered By: Doristine Devoid CMA (July 27, 2010 1:44 PM) CC: f/u on bp needs refill on meds    History of Present Illness: 45 yo woman here today for f/u on BP.  has lost 20 lbs- watching diet and walking more.  taking BP meds regularly.  no CP, SOB, HAs, visual changes, edema.  had 'incident' just prior to arrival which 'stressed me out'.  BP has been normal when she checks at home.  anemia- still taking iron daily.  cancelled GI appt, feels anemia is period related.  no family hx of colon cancer, no blood in stools.  pt has very heavy periods.  due for labs.  Preventive Screening-Counseling & Management  Alcohol-Tobacco     Smoking Status: never  Caffeine-Diet-Exercise     Does Patient Exercise: yes     Type of exercise: walking      Drug Use:  never.    Current Medications (verified): 1)  Hydrochlorothiazide 25 Mg Tabs (Hydrochlorothiazide) .... 1/2  Tablet By Mouth Daily 2)  Iron 325 (65 Fe) Mg Tabs (Ferrous Sulfate) .... One Tablet By Mouth Three Times A Day Prn 3)  Labetalol Hcl 300 Mg Tabs (Labetalol Hcl) .... 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 Lungs are clear to auscultation, no crackles or wheezes. Heart:  Normal rate and regular rhythm. S1 and S2 normal without gallop, murmur, click, rub or  other extra sounds. Pulses:  +2 carotid, radial, DP Extremities:  No clubbing, cyanosis, edema, or deformity noted   Impression & Recommendations:  Problem # 1:  HYPERTENSION (ICD-401.9) Assessment Unchanged pt's BP elevated today but she feels this is due to stressor just prior to appt.  will have her return for recheck in 3-4 weeks.  check labs. Her updated medication list for this problem includes:    Hydrochlorothiazide 25 Mg Tabs (Hydrochlorothiazide) .Marland Kitchen... 1/2  tablet by mouth daily    Labetalol Hcl 300 Mg Tabs (Labetalol hcl) ..... One tab by mouth two times a day  Orders: TLB-BMP (Basic Metabolic Panel-BMET) (80048-METABOL) Specimen Handling (04540)  Problem # 2:  ANEMIA, IRON DEFICIENCY, MICROCYTIC (ICD-280.8) Assessment: Unchanged pt did not go to GI for evaluation.  feels anemia is related to heavy periods.  no family hx of colon cancer and no blood in stools.  pt is low risk for GI abnormality and common things being common- this is related to menorrhagia.  will follow. Her updated medication list for this problem includes:    Iron 325 (65 Fe) Mg Tabs (Ferrous sulfate) ..... One tablet by mouth three times a day prn  Orders: TLB-CBC Platelet - w/Differential (85025-CBCD) Specimen Handling (98119)  Problem # 3:  OBESITY, MORBID (ICD-278.01) Assessment: Unchanged pt has lost 20 lbs.  applauded  pt's diet and exercise efforts.  will follow.  Complete Medication List: 1)  Hydrochlorothiazide 25 Mg Tabs (Hydrochlorothiazide) .... 1/2  tablet by mouth daily 2)  Iron 325 (65 Fe) Mg Tabs (Ferrous sulfate) .... One tablet by mouth three times a day prn 3)  Labetalol Hcl 300 Mg Tabs (Labetalol hcl) .... One tab by mouth two times a day  Patient Instructions: 1)  Schedule a nurse visit in 3-4 weeks to recheck BP 2)  Schedule your physical for December- don't eat before this appt 3)  Continue your current meds 4)  We'll notify you of your lab results 5)  Keep up the great  work on diet and exercise!! 6)  Call with any questions or concerns! 7)  Have a great Labor Day! Prescriptions: LABETALOL HCL 300 MG TABS (LABETALOL HCL) one tab by mouth two times a day  #60 x 6   Entered and Authorized by:   Neena Rhymes MD   Signed by:   Neena Rhymes MD on 07/27/2010   Method used:   Electronically to        [facility] (retail)       [address]       [place], [place]  [id number]       Ph: [phone]       Fax: [phone]   RxID:   [id number]

## 2011-01-17 NOTE — Progress Notes (Signed)
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  She stated that her manager said they would not be able to let someone from the outside come in to do a work site evaluation for pt's needs as Mr. Elberta Leatherwood was able to do this through their company.  I spoke to Mr. Elberta Leatherwood and he is requesting that we send a letter stating that the pt. needs to stand periodically for certain periods of time throughout the day to relieve her back pain and have the pt. give a copy to her manager.  He states he will email the pt and let her know what steps she needs to take from here.  I advised pt. of above info.  Pt requests that I mail the letter to her when completed.  Can you do the letter?  Mervin Kung CMA  Apr 20, 2010 3:16 PM

## 2011-01-17 NOTE — Letter (Signed)
   [place] [facility] [address] [place], [place]  [address]  [place] Phone: [phone]      February 09, 2010   [facility] [place] [address] [address] [place], [place] [address]  RE:  LAB RESULTS  Dear  Ms. Carreno,  The following is an interpretation of your most recent lab tests.  Please take note of any instructions provided or changes to medications that have resulted from your lab work.   CBC:  Stable - no changes needed Your hemoglobin is still low, but slowly improving.  Continue iron 3x daily.   Sincerely Yours,    Lemont Fillers FNP

## 2011-01-17 NOTE — Assessment & Plan Note (Signed)
Summary: BP CHECK/CBS  Nurse Visit   Vital Signs:  Patient profile:   45 year old female Menstrual status:  regular Weight:      333 pounds BP sitting:   140 / 90  (left arm)   Current Medications (verified): 1)  Hydrochlorothiazide 25 Mg Tabs (Hydrochlorothiazide) .... 1/2  Tablet By Mouth Daily 2)  Iron 325 (65 Fe) Mg Tabs (Ferrous Sulfate) .... One Tablet By Mouth Three Times A Day Prn 3)  Labetalol Hcl 300 Mg Tabs (Labetalol Hcl) .... 2 Tabs By Mouth Two Times A Day  Allergies (verified): No Known Drug Allergies  History of Present Illness: 45 yo woman here today for f/u on HTN.  hasn't been checking BP at home.  no CP, SOB, visual changes, edema.  low grade HA.  no dizziness.  reports taking HCTZ and labetalol daily.  (although HCTZ was last filled in April for 1 month- should have run out months ago)  pt not actively trying to get pregnant but isn't preventing it either and would be excited if it happened.   Review of Systems      See HPI   Physical Exam  General:  morbidly obese female in NAD Lungs:  Normal respiratory effort, chest expands symmetrically. Lungs are clear to auscultation, no crackles or wheezes. Heart:  Normal rate and regular rhythm. S1 and S2 normal without gallop, murmur, click, rub or other extra sounds. Pulses:  +2 carotid, radial, DP Extremities:  No clubbing, cyanosis, edema, or deformity noted   Impression & Recommendations:  Problem # 1:  HYPERTENSION (ICD-401.9) Assessment Unchanged BP not adequately controlled.  increase labetalol to 600mg  two times a day.  will follow closely. Her updated medication list for this problem includes:    Hydrochlorothiazide 25 Mg Tabs (Hydrochlorothiazide) .Marland Kitchen... 1/2  tablet by mouth daily    Labetalol Hcl 300 Mg Tabs (Labetalol hcl) .Marland Kitchen... 2 tabs by mouth two times a day  Complete Medication List: 1)  Hydrochlorothiazide 25 Mg Tabs (Hydrochlorothiazide) .... 1/2  tablet by mouth daily 2)  Iron 325 (65 Fe)  Mg Tabs (Ferrous sulfate) .... One tablet by mouth three times a day prn 3)  Labetalol Hcl 300 Mg Tabs (Labetalol hcl) .... 2 tabs by mouth two times a day   Patient Instructions: 1)  Follow up in 1 month to recheck BP 2)  Increase the Labetalol to 600mg  two times a day (2 pills twice a day) 3)  Continue the HCTZ 4)  Call with any questions or concerns 5)  Hang in there!   Orders Added: 1)  Est. Patient Level III [[address]] Prescriptions: IRON 325 (65 FE) MG TABS (FERROUS SULFATE) one tablet by mouth three times a day prn  #90 x 3   Entered and Authorized by:   Neena Rhymes MD   Signed by:   Neena Rhymes MD on 08/17/2010   Method used:   Electronically to        [facility] [address] (retail)       [address]       [place], [place]  [id number]       Ph: [phone]       Fax: [phone]   RxID:   [id number]

## 2011-01-17 NOTE — Miscellaneous (Signed)
Summary: PT Discharge/[facility]  PT Discharge/[facility]   Imported By: Lanelle Bal 02/10/2010 12:24:27  _____________________________________________________________________  External Attachment:    Type:   Image     Comment:   External Document

## 2011-01-19 NOTE — Progress Notes (Signed)
Summary: labs-  Phone Note Outgoing Call   Call placed by: Doristine Devoid CMA,  December 01, 2010 4:26 PM Call placed to: Patient Summary of Call: vit d is low.  should start 50,000 units weekly x12 weeks and then recheck.  should also take Ca + D daily   Follow-up for Phone Call        left message on machine no answer will try back ....Marland KitchenMarland KitchenDoristine Devoid CMA  December 01, 2010 4:26 PM   left message on machine ........Marland KitchenDoristine Devoid [facility]  December 02, 2010 3:55 PM   Additional Follow-up for Phone Call Additional follow up Details #1::        Pt aware, Rx sent to pharmacy...............Marland KitchenFelecia Deloach CMA  December 02, 2010 4:19 PM     New/Updated Medications: VITAMIN D (ERGOCALCIFEROL) 50000 UNIT CAPS (ERGOCALCIFEROL) take one tablet weekly x12 weeks Prescriptions: VITAMIN D (ERGOCALCIFEROL) 50000 UNIT CAPS (ERGOCALCIFEROL) take one tablet weekly x12 weeks  #12 x 0   Entered by:   Doristine Devoid CMA   Authorized by:   Neena Rhymes MD   Signed by:   Jeremy Johann CMA on 12/02/2010   Method used:   Electronically to        [facility] [facility] (retail)       [address]       [place], [place]  [id number]       Ph: [phone]       Fax: [phone]   RxID:   [id number]

## 2011-01-19 NOTE — Assessment & Plan Note (Signed)
Summary: CPX-LAB/CBS   Vital Signs:  Patient profile:   45 year old female Menstrual status:  regular Height:      70.75 inches Weight:      330 pounds BMI:     46.52 Pulse rate:   85 / minute BP sitting:   140 / 80  (left arm)  Vitals Entered By: Doristine Devoid CMA (November 30, 2010 8:17 AM) CC: CPX AND LABS   History of Present Illness: 45 yo woman here today for CPE.  GYN- Dr Jennette Kettle, UTD on pap/mammo.  no concerns today.  Preventive Screening-Counseling & Management  Alcohol-Tobacco     Alcohol drinks/day: <1     Smoking Status: never  Caffeine-Diet-Exercise     Does Patient Exercise: yes     Type of exercise: walking     Times/week: <3      Sexual History:  currently monogamous.        Drug Use:  never.    Current Medications (verified): 1)  Hydrochlorothiazide 25 Mg Tabs (Hydrochlorothiazide) .... 1/2  Tablet By Mouth Daily 2)  Iron 325 (65 Fe) Mg Tabs (Ferrous Sulfate) .... One Tablet By Mouth Three Times A Day Prn 3)  Labetalol Hcl 300 Mg Tabs (Labetalol Hcl) .... 2 Tabs By Mouth Two Times A Day  Allergies (verified): No Known Drug Allergies  Past History:  Past medical, surgical, family and social histories (including risk factors) reviewed, and no changes noted (except as noted below).  Past Medical History: Reviewed history from 07/27/2010 and no changes required. Hypertension morbid obesity anemia  Past Surgical History: Reviewed history from 11/29/2009 and no changes required. tracheostomy age 33 due to polyps- reports currently stable  Family History: Reviewed history from 11/29/2009 and no changes required. Mom- HTN Dad-HTN no children 3 brothers-A and W 3 sisters-one sister with hx of pneumothorax (patient is the oldest) colon cancer- none breast cancer- none  Social History: Reviewed history from 07/27/2010 and no changes required. 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    Physical Exam  General:  morbidly obese female in NAD Head:  Normocephalic and atraumatic without obvious abnormalities. No apparent alopecia or balding. Eyes:  No corneal or conjunctival inflammation noted. EOMI. Perrla. Funduscopic exam benign, without hemorrhages, exudates or papilledema. Vision grossly normal. Ears:  External ear exam shows no significant lesions or deformities.  Otoscopic examination reveals clear canals, tympanic membranes are intact bilaterally without bulging, retraction, inflammation or discharge. Hearing is grossly normal bilaterally. Nose:  External nasal examination shows no deformity or inflammation. Nasal mucosa are pink and moist without lesions or exudates. Mouth:  Oral mucosa and oropharynx without lesions or exudates. Neck:  former tracheostomy scar, otherwise normal Breasts:  gyn Lungs:  Normal respiratory effort, chest expands symmetrically. Lungs are clear to auscultation, no crackles or wheezes. Heart:  Normal rate and regular rhythm. S1 and S2 normal without gallop, murmur, click, rub or other extra sounds. Abdomen:  Bowel sounds positive,abdomen soft and non-tender without masses, organomegaly or hernias noted.  obese Genitalia:  gyn Pulses:  +2 carotid, radial, DP Extremities:  No clubbing, cyanosis,  edema, or deformity noted Neurologic:  No cranial nerve deficits noted. Station and gait are normal. Plantar reflexes are down-going bilaterally. DTRs are symmetrical throughout. Sensory, motor and  coordinative functions appear intact. Skin:  Intact without suspicious lesions or rashes Cervical Nodes:  No lymphadenopathy noted Psych:  Cognition and judgment appear intact. Alert and cooperative with normal attention span and concentration. No apparent delusions, illusions, hallucinations   Impression & Recommendations:  Problem # 1:  PREVENTIVE HEALTH CARE (ICD-V70.0) Assessment Unchanged pt' PE WNL.  check labs.  UTD on pap, mammo.  anticipatory guidance provided. Orders: T-Vitamin D (25-Hydroxy) [id number])  Problem # 2:  HYPERTENSION (ICD-401.9) Assessment: Unchanged BP not ideal today but pt admits to being nervous for CPE.  check labs.  continue current meds but follow closely. Her updated medication list for this problem includes:    Hydrochlorothiazide 25 Mg Tabs (Hydrochlorothiazide) .Marland Kitchen... 1/2  tablet by mouth daily    Labetalol Hcl 300 Mg Tabs (Labetalol hcl) .Marland Kitchen... 2 tabs by mouth two times a day  Orders: Venipuncture ([address]) TLB-BMP (Basic Metabolic Panel-BMET) (80048-METABOL) TLB-CBC Platelet - w/Differential (85025-CBCD) TLB-TSH (Thyroid Stimulating Hormone) (84443-TSH)  Problem # 3:  OBESITY, MORBID (ICD-278.01) Assessment: Unchanged strongly encouraged pt to increase amount and frequency of exercise.  applauded healthy dietary changes. Orders: TLB-Lipid Panel (80061-LIPID) TLB-Hepatic/Liver Function Pnl (80076-HEPATIC)  Problem # 4:  CONTACT OR EXPOSURE TO OTHER VIRAL DISEASES (ICD-V01.79) Assessment: New check labs at pt request. Orders: T-HIV Antibody  (Reflex) ([id number]) T-RPR (Syphilis) ([id number])  Complete Medication List: 1)  Hydrochlorothiazide 25 Mg Tabs (Hydrochlorothiazide) .... 1/2  tablet by mouth daily 2)  Iron 325 (65 Fe) Mg Tabs (Ferrous sulfate) .... One tablet by mouth three times a day prn 3)  Labetalol Hcl 300 Mg Tabs (Labetalol hcl) .... 2 tabs by mouth two times a day  Patient Instructions: 1)  Follow up in 3 months to  recheck blood pressure 2)  Keep up the good work on healthy food choices- try and increase your exercise to 4x/week 3)  We'll notify you of your lab results 4)  Call with any questions or concerns 5)  Happy Holidays!!!   Orders Added: 1)  Venipuncture [36415] 2)  TLB-BMP (Basic Metabolic Panel-BMET) [80048-METABOL] 3)  TLB-CBC Platelet - w/Differential [85025-CBCD] 4)  TLB-TSH (Thyroid Stimulating Hormone) [84443-TSH] 5)  TLB-Lipid Panel [80061-LIPID] 6)  TLB-Hepatic/Liver Function Pnl [80076-HEPATIC] 7)  T-Vitamin D (25-Hydroxy) [96295-28413] 8)  T-HIV Antibody  (Reflex) [[id number]] 9)  T-RPR (Syphilis) [36644-03474] 10)  Est. Patient 40-64 years [[address]]  Appended Document: CPX-LAB/CBS

## 2011-02-11 ENCOUNTER — Encounter: Payer: Self-pay | Admitting: Family Medicine

## 2011-02-28 ENCOUNTER — Ambulatory Visit: Payer: Self-pay | Admitting: Family Medicine

## 2011-02-28 DIAGNOSIS — Z0289 Encounter for other administrative examinations: Secondary | ICD-10-CM

## 2011-03-21 LAB — URINE MICROSCOPIC-ADD ON

## 2011-03-21 LAB — URINE CULTURE: Colony Count: >=100000

## 2011-03-21 LAB — URINALYSIS, ROUTINE W REFLEX MICROSCOPIC
Bilirubin Urine: NEGATIVE
Ketones, ur: NEGATIVE mg/dL

## 2011-10-15 ENCOUNTER — Emergency Department (HOSPITAL_COMMUNITY)
Admission: EM | Admit: 2011-10-15 | Discharge: 2011-10-15 | Disposition: A | Discharge status: Home / Self Care | Payer: 59 | Attending: Emergency Medicine | Admitting: Emergency Medicine

## 2011-10-15 DIAGNOSIS — I10 Essential (primary) hypertension: Secondary | ICD-10-CM | POA: Insufficient documentation

## 2011-10-15 DIAGNOSIS — L500 Allergic urticaria: Secondary | ICD-10-CM | POA: Insufficient documentation

## 2011-10-15 DIAGNOSIS — L299 Pruritus, unspecified: Secondary | ICD-10-CM | POA: Insufficient documentation

## 2011-10-15 DIAGNOSIS — R070 Pain in throat: Secondary | ICD-10-CM | POA: Insufficient documentation

## 2011-10-15 DIAGNOSIS — R1310 Dysphagia, unspecified: Secondary | ICD-10-CM | POA: Insufficient documentation

## 2011-10-15 LAB — POCT I-STAT, CHEM 8
BUN: 12 mg/dL (ref 6–23)
Creatinine, Ser: 1 mg/dL (ref 0.50–1.10)
HCT: 37 % (ref 36.0–46.0)
Hemoglobin: 12.6 g/dL (ref 12.0–15.0)
Sodium: 141 mEq/L (ref 135–145)

## 2012-04-16 ENCOUNTER — Encounter: Payer: Self-pay | Admitting: Family

## 2012-04-16 ENCOUNTER — Ambulatory Visit (HOSPITAL_BASED_OUTPATIENT_CLINIC_OR_DEPARTMENT_OTHER)
Admission: RE | Admit: 2012-04-16 | Discharge: 2012-04-16 | Disposition: A | Discharge status: Home / Self Care | Payer: 59 | Source: Ambulatory Visit | Attending: Family | Admitting: Family

## 2012-04-16 ENCOUNTER — Ambulatory Visit (INDEPENDENT_AMBULATORY_CARE_PROVIDER_SITE_OTHER): Payer: 59 | Admitting: Family

## 2012-04-16 VITALS — BP 170/80 | HR 93 | Temp 98.9°F | Resp 18 | Wt 330.1 lb

## 2012-04-16 DIAGNOSIS — E876 Hypokalemia: Secondary | ICD-10-CM

## 2012-04-16 DIAGNOSIS — Z0189 Encounter for other specified special examinations: Secondary | ICD-10-CM

## 2012-04-16 DIAGNOSIS — R05 Cough: Secondary | ICD-10-CM

## 2012-04-16 DIAGNOSIS — J40 Bronchitis, not specified as acute or chronic: Secondary | ICD-10-CM

## 2012-04-16 DIAGNOSIS — R059 Cough, unspecified: Secondary | ICD-10-CM | POA: Insufficient documentation

## 2012-04-16 DIAGNOSIS — I517 Cardiomegaly: Secondary | ICD-10-CM | POA: Insufficient documentation

## 2012-04-16 DIAGNOSIS — I10 Essential (primary) hypertension: Secondary | ICD-10-CM

## 2012-04-16 MED ORDER — AZITHROMYCIN 250 MG PO TABS: ORAL_TABLET | ORAL | Status: AC

## 2012-04-16 MED ORDER — BENZONATATE 100 MG PO CAPS: 100.0000 mg | ORAL_CAPSULE | Freq: Three times a day (TID) | ORAL | Status: AC | PRN

## 2012-04-16 MED ORDER — HYDROCHLOROTHIAZIDE 25 MG PO TABS: 25.0000 mg | ORAL_TABLET | Freq: Every day | ORAL | Status: DC

## 2012-04-16 MED ORDER — ALBUTEROL SULFATE HFA 108 (90 BASE) MCG/ACT IN AERS: 2.0000 | INHALATION_SPRAY | Freq: Four times a day (QID) | RESPIRATORY_TRACT | Status: DC | PRN

## 2012-04-16 MED ORDER — LABETALOL HCL 300 MG PO TABS: ORAL_TABLET | ORAL | Status: DC

## 2012-04-16 NOTE — Progress Notes (Signed)
  Subjective:    Patient ID: Margaret Barron, female    DOB: December 09, 1966, 46 y.o.   MRN: [id number]  HPI  Ms.  Blinn is a 46 yr old female who presents today with chief complaint of cough.  Symptoms  started 6 days ago.  She reports + wheezing.  She reports + sinus pain/pressure and nasal congestion (clear nasal drainage).  Cough is productive in the AM's, bringing up clear sputum.  She reports mild ear pain mainly in the left ear.  +HA with light sensitivity.  Denies sore throat.  +SOB with exertion.  She had N/V/D earlier in the illness.  She had diarrhea sat and Sunday.  1 loose BM today. No current nausea or vomitting.    Review of Systems See HPI  Past Medical History  Diagnosis Date  . Hypertension   . Morbid obesity   . Anemia     History   Social History  . Marital Status: Single    Spouse Name: N/A    Number of Children: N/A  . Years of Education: N/A   Occupational History  . Not on file.   Social History Main Topics  . Smoking status: Never Smoker   . Smokeless tobacco: Never Used  . Alcohol Use: Not on file  . Drug Use: Not on file  . Sexually Active: Not on file   Other Topics Concern  . Not on file   Social History Narrative  . No narrative on file    Past Surgical History  Procedure Date  . Tracheostomy     Family History  Problem Relation Age of Onset  . Hypertension Mother   . Hypertension Father     No Known Allergies  Current Outpatient Prescriptions on File Prior to Visit  Medication Sig Dispense Refill  . Ferrous Sulfate (IRON) 325 (65 FE) MG TABS Take by mouth. One tablet by mouth three times a day prn       . hydrochlorothiazide (HYDRODIURIL) 25 MG tablet Take 1 tablet (25 mg total) by mouth daily. 1/2 tablet daily  30 tablet  2  . albuterol (PROVENTIL HFA;VENTOLIN HFA) 108 (90 BASE) MCG/ACT inhaler Inhale 2 puffs into the lungs every 6 (six) hours as needed for wheezing.  1 Inhaler  0  . labetalol (NORMODYNE) 300 MG tablet 2 1/2  tabs by mouth twice daily  150 tablet  0  . potassium chloride SA (K-DUR,KLOR-CON) 20 MEQ tablet Take 1 tablet (20 mEq total) by mouth daily.  30 tablet  0    BP 170/80  Pulse 93  Temp(Src) 98.9 F (37.2 C) (Oral)  Resp 18  Wt 330 lb 1.3 oz (149.723 kg)  SpO2 99%  LMP 04/04/2012       Objective:   Physical Exam  Constitutional: She appears well-developed and well-nourished. No distress.  HENT:  Right Ear: Tympanic membrane and ear canal normal.  Left Ear: Tympanic membrane and ear canal normal.  Mouth/Throat: No oropharyngeal exudate or posterior oropharyngeal edema.  Cardiovascular: Normal rate and regular rhythm.   No murmur heard. Pulmonary/Chest: Effort normal and breath sounds normal. No respiratory distress. She has no wheezes. She has no rales. She exhibits no tenderness.  Musculoskeletal: She exhibits no edema.  Psychiatric: She has a normal mood and affect. Her behavior is normal. Judgment and thought content normal.          Assessment & Plan:

## 2012-04-16 NOTE — Assessment & Plan Note (Addendum)
BP Readings from Last 3 Encounters:  04/16/12 170/80  11/30/10 140/80  11/30/10 140/80   Deteriorated.  She tells me that she is taking her BP meds- though her last office visit was 12/11 and I see no refills on our end since 2011.  She also tells me that she has not seen any providers outside of [facility].  She still is actively trying to become pregnant.  Currenty on labetalol 600mg  bid and hctz which she tells me she took today.  Will increase labetalol to 800mg  bid and hctz from 12.5 to 25mg .  She is instructed to follow up with Dr. Beverely Low in 1 week.

## 2012-04-16 NOTE — Patient Instructions (Signed)
Please follow up with Dr. Beverely Low in 2 weeks.  Call sooner if symptoms worsen, or if you are not feeling better in 2-3 days. Complete chest x-ray on the first floor.

## 2012-04-17 ENCOUNTER — Telehealth: Payer: Self-pay | Admitting: Family

## 2012-04-17 DIAGNOSIS — E876 Hypokalemia: Secondary | ICD-10-CM | POA: Insufficient documentation

## 2012-04-17 DIAGNOSIS — J40 Bronchitis, not specified as acute or chronic: Secondary | ICD-10-CM | POA: Insufficient documentation

## 2012-04-17 LAB — BASIC METABOLIC PANEL WITH GFR
GFR, Est African American: 67 mL/min
GFR, Est Non African American: 58 mL/min — ABNORMAL LOW
Glucose, Bld: 128 mg/dL — ABNORMAL HIGH (ref 70–99)

## 2012-04-17 MED ORDER — POTASSIUM CHLORIDE CRYS ER 20 MEQ PO TBCR
20.0000 meq | EXTENDED_RELEASE_TABLET | Freq: Every day | ORAL | Status: DC
Start: 2012-04-17 — End: 2013-09-03

## 2012-04-17 NOTE — Telephone Encounter (Addendum)
Please call pt and let her know that her chest x-ray is negative for pneumonia.  Also, potassium is mildly low. I would like her to add a potassium supplement.  She will need a 1 week follow up appointment with Dr Beverely Low and will need to have her  lab work repeated at that time.

## 2012-04-17 NOTE — Assessment & Plan Note (Addendum)
CXR is performed and is negative for pneumonia.  Will plan to rx with zithromax, prn tessalon and prn albuterol.

## 2012-04-17 NOTE — Assessment & Plan Note (Signed)
Start low dose potassium supplement.  She will need follow up BMET in 1 week at her follow up appointment.

## 2012-04-17 NOTE — Telephone Encounter (Signed)
Left message on home/cell# to return my call. 

## 2012-04-18 ENCOUNTER — Encounter: Payer: Self-pay | Admitting: Family Medicine

## 2012-04-18 ENCOUNTER — Ambulatory Visit: Payer: 59 | Admitting: Family Medicine

## 2012-04-18 ENCOUNTER — Ambulatory Visit (INDEPENDENT_AMBULATORY_CARE_PROVIDER_SITE_OTHER): Payer: 59 | Admitting: Family Medicine

## 2012-04-18 ENCOUNTER — Telehealth: Payer: Self-pay | Admitting: Family Medicine

## 2012-04-18 VITALS — BP 165/85 | HR 78 | Temp 98.2°F | Ht 71.0 in | Wt 334.7 lb

## 2012-04-18 DIAGNOSIS — I10 Essential (primary) hypertension: Secondary | ICD-10-CM

## 2012-04-18 DIAGNOSIS — J329 Chronic sinusitis, unspecified: Secondary | ICD-10-CM | POA: Insufficient documentation

## 2012-04-18 MED ORDER — SULFAMETHOXAZOLE-TRIMETHOPRIM 800-160 MG PO TABS: 1.0000 | ORAL_TABLET | Freq: Two times a day (BID) | ORAL | Status: AC

## 2012-04-18 MED ORDER — GUAIFENESIN-CODEINE 100-10 MG/5ML PO SYRP: 10.0000 mL | ORAL_SOLUTION | Freq: Three times a day (TID) | ORAL | Status: AC | PRN

## 2012-04-18 NOTE — Telephone Encounter (Signed)
Attempted to reach pt. Left detailed message on cell# re: instructions below and to call if any questions.

## 2012-04-18 NOTE — Telephone Encounter (Signed)
Caller: Charma/Patient; PCP: Sheliah Hatch.; CB#: [phone]; Call regarding Patient Put On Antibiotic At [facility] for A Bronchial Inf. Sore throat onset last PM, woke with hoarseness this AM. Advil 400 mg @ 0815 w/o relief. Marked difficulty swallowing due to pain.  On an Azithromycin since 04/16/12. Appt sched for 1030 today with Dr. Beverely Low. ST Protocol.

## 2012-04-18 NOTE — Patient Instructions (Signed)
Follow up in 2-4 weeks to recheck blood pressure STOP the Zpack START the Doxy twice daily (w/ food) Continue the inhaler as needed Use the cough syrup- will cause drowsiness Plenty of fluids REST! If your symptoms change or worsen- particularly the shortness of breath- call or go to the ER Hang in there!

## 2012-04-18 NOTE — Assessment & Plan Note (Signed)
New.  Pt's PE and sxs consistent w/ infxn.  Stop Zpack, switch to Bactrim.  Reviewed supportive care and red flags that should prompt return.  Pt expressed understanding and is in agreement w/ plan.

## 2012-04-18 NOTE — Assessment & Plan Note (Signed)
Deteriorated since last visit.  Likely due to pt's current illness, pain.  No med changes at this time but will follow closely.  Reviewed supportive care and red flags that should prompt return.  Pt expressed understanding and is in agreement w/ plan.

## 2012-04-18 NOTE — Progress Notes (Signed)
  Subjective:    Patient ID: Margaret Barron, female    DOB: 1966/11/10, 46 y.o.   MRN: [id number]  HPI Dizziness- intermittent.  Has 'felt like passing out' x3 since last week.  Will get vertigo w/ movement.  Sore throat- saw Melissa 2 days ago, started on Zpack for bronchitis.  Reports she is not feeling any better.  sxs initially started 1 week ago.  + cough, fatigue, wheezing.  Using albuterol HFA prn.  + SOB w/ walking.  Cough is dry.  + facial pain/pressure.  L ear pain.   Review of Systems For ROS see HPI     Objective:   Physical Exam  Vitals reviewed. Constitutional: She appears well-developed and well-nourished. No distress.  HENT:  Head: Normocephalic and atraumatic.  Right Ear: Tympanic membrane normal.  Left Ear: Tympanic membrane normal.  Nose: Mucosal edema and rhinorrhea present. Right sinus exhibits frontal sinus tenderness. Right sinus exhibits no maxillary sinus tenderness. Left sinus exhibits frontal sinus tenderness. Left sinus exhibits no maxillary sinus tenderness.  Mouth/Throat: Uvula is midline and mucous membranes are normal. Posterior oropharyngeal erythema present. No oropharyngeal exudate.  Eyes: Conjunctivae and EOM are normal. Pupils are equal, round, and reactive to light.  Neck: Normal range of motion. Neck supple.  Cardiovascular: Normal rate, regular rhythm and normal heart sounds.   Pulmonary/Chest: Effort normal and breath sounds normal. No respiratory distress. She has no wheezes.  Lymphadenopathy:    She has no cervical adenopathy.          Assessment & Plan:

## 2012-04-19 ENCOUNTER — Ambulatory Visit: Payer: 59 | Admitting: Family Medicine

## 2012-05-06 ENCOUNTER — Telehealth: Payer: Self-pay | Admitting: *Deleted

## 2012-05-06 NOTE — Telephone Encounter (Signed)
Called pt to verify the reason for the FMLA forms that were dropped off for MD Tabori to fill out, per noted last OV per sinus infection, left vm to call office

## 2012-05-07 NOTE — Telephone Encounter (Signed)
i need the dates of absence- 7 days for a sinus infxn is excessive

## 2012-05-07 NOTE — Telephone Encounter (Signed)
Called pt to clarify dates she was absent from work, noted as 04-16-12 and [employer] told her to return to 04-18-12, thus needs a note from 04-18-12 til 04-22-12, pt did state that [employer] did give her a note for the time she was out from 04-16-12 til 04-18-12, pt was seen by MD Tabori on 04-18-12

## 2012-05-07 NOTE — Telephone Encounter (Signed)
Pt return call stating that FMLA is for when she had sinus infection. Pt indicated that she was out of work for more than 7 days so her job is requiring this to be done. Pt notes that this needs to be completed by tomorrow and faxed

## 2012-05-08 DIAGNOSIS — Z0279 Encounter for issue of other medical certificate: Secondary | ICD-10-CM

## 2012-05-08 NOTE — Telephone Encounter (Signed)
Called pt to advise the forms have been completed and placed up front for pick up, left vm

## 2012-05-08 NOTE — Telephone Encounter (Signed)
Forms completed

## 2012-05-17 ENCOUNTER — Ambulatory Visit (INDEPENDENT_AMBULATORY_CARE_PROVIDER_SITE_OTHER): Payer: 59 | Admitting: Family Medicine

## 2012-05-17 ENCOUNTER — Encounter: Payer: Self-pay | Admitting: Family Medicine

## 2012-05-17 VITALS — BP 132/82 | HR 97 | Temp 98.3°F | Ht 72.0 in | Wt 342.3 lb

## 2012-05-17 DIAGNOSIS — I10 Essential (primary) hypertension: Secondary | ICD-10-CM

## 2012-05-17 NOTE — Patient Instructions (Signed)
Schedule your complete physical at your convenience- don't eat before this Your blood pressure looks MUCH better!  No changes at this time! Call with any questions or concerns Have a great summer!!!

## 2012-05-17 NOTE — Progress Notes (Signed)
  Subjective:    Patient ID: Margaret Barron, female    DOB: 1966-02-20, 46 y.o.   MRN: [id number]  HPI HTN- chronic problem, was elevated at last visit but pt was sick.  Today much better.  No CP, SOB, HAs, visual changes, edema.   Review of Systems For ROS see HPI     Objective:   Physical Exam  Vitals reviewed. Constitutional: She is oriented to person, place, and time. She appears well-developed and well-nourished. No distress.       obese  HENT:  Head: Normocephalic and atraumatic.  Eyes: Conjunctivae and EOM are normal. Pupils are equal, round, and reactive to light.  Neck: Normal range of motion. Neck supple. No thyromegaly present.  Cardiovascular: Normal rate, regular rhythm, normal heart sounds and intact distal pulses.   No murmur heard. Pulmonary/Chest: Effort normal and breath sounds normal. No respiratory distress.  Abdominal: Soft. She exhibits no distension. There is no tenderness.  Musculoskeletal: She exhibits no edema.  Lymphadenopathy:    She has no cervical adenopathy.  Neurological: She is alert and oriented to person, place, and time.  Skin: Skin is warm and dry.  Psychiatric: She has a normal mood and affect. Her behavior is normal.          Assessment & Plan:

## 2012-05-19 NOTE — Assessment & Plan Note (Signed)
Improved.  Asymptomatic.  No med changes.  Will continue to follow.

## 2013-09-03 ENCOUNTER — Emergency Department (HOSPITAL_COMMUNITY)
Admission: EM | Admit: 2013-09-03 | Discharge: 2013-09-04 | Disposition: A | Discharge status: Home / Self Care | Payer: 59 | Attending: Emergency Medicine | Admitting: Emergency Medicine

## 2013-09-03 ENCOUNTER — Encounter (HOSPITAL_COMMUNITY): Payer: Self-pay | Admitting: *Deleted

## 2013-09-03 DIAGNOSIS — I10 Essential (primary) hypertension: Secondary | ICD-10-CM | POA: Insufficient documentation

## 2013-09-03 DIAGNOSIS — Z862 Personal history of diseases of the blood and blood-forming organs and certain disorders involving the immune mechanism: Secondary | ICD-10-CM | POA: Insufficient documentation

## 2013-09-03 DIAGNOSIS — M62838 Other muscle spasm: Secondary | ICD-10-CM | POA: Insufficient documentation

## 2013-09-03 DIAGNOSIS — M25519 Pain in unspecified shoulder: Secondary | ICD-10-CM | POA: Insufficient documentation

## 2013-09-03 DIAGNOSIS — G8929 Other chronic pain: Secondary | ICD-10-CM | POA: Insufficient documentation

## 2013-09-03 DIAGNOSIS — M5412 Radiculopathy, cervical region: Secondary | ICD-10-CM | POA: Insufficient documentation

## 2013-09-03 DIAGNOSIS — Z79899 Other long term (current) drug therapy: Secondary | ICD-10-CM | POA: Insufficient documentation

## 2013-09-03 DIAGNOSIS — M545 Low back pain, unspecified: Secondary | ICD-10-CM | POA: Insufficient documentation

## 2013-09-03 DIAGNOSIS — R209 Unspecified disturbances of skin sensation: Secondary | ICD-10-CM | POA: Insufficient documentation

## 2013-09-03 LAB — BASIC METABOLIC PANEL
CO2: 31 mEq/L (ref 19–32)
Calcium: 8.9 mg/dL (ref 8.4–10.5)
GFR calc non Af Amer: 72 mL/min — ABNORMAL LOW (ref >90)
Sodium: 141 mEq/L (ref 135–145)

## 2013-09-03 LAB — TROPONIN I: Troponin I: <0.3 ng/mL (ref <0.30)

## 2013-09-03 LAB — CBC
MCH: 23.7 pg — ABNORMAL LOW (ref 26.0–34.0)
Platelets: 248 10*3/uL (ref 150–400)
RBC: 4.48 MIL/uL (ref 3.87–5.11)

## 2013-09-03 NOTE — ED Provider Notes (Signed)
CSN: [id number]     Arrival date & time 09/03/13  1821 History   First MD Initiated Contact with Patient 09/03/13 2251     Chief Complaint  Patient presents with  . Neck Pain   (Consider location/radiation/quality/duration/timing/severity/associated sxs/prior Treatment) The history is provided by the patient and medical records. No language interpreter was used.    Margaret Barron is a 47 y.o. female  with a hx of HTN, obesity, anemia presents to the Emergency Department complaining of gradual, persistent, progressively worsening left shoulder pain onset yesterday evening.  Pt reports that she began with left shoulder pain last night that progressed to left sided neck pain early this morning. Associated symptoms include paresthesias of the left arm and hand beginning midday.  Pt reports that laying on the Left arm makes the pain worse.  Lifting the arm slightly makes the neck pain and paresthesias better. Pt reports that when this happens she usually takes ibuprofen and uses a heating pad, but it did not help tonight. Pt denies falls or known trauma.  Pt denies fever, chills, headache, chest pain, shortness of breath, abdominal pain, nausea, vomiting, diarrhea, weakness, numbness, syncope, dysuria, hematuria. Patient denies trauma to her neck, falls, hitting her head or other known neck injury.   Past Medical History  Diagnosis Date  . Hypertension   . Morbid obesity   . Anemia    Past Surgical History  Procedure Laterality Date  . Tracheostomy     Family History  Problem Relation Age of Onset  . Hypertension Mother   . Hypertension Father    History  Substance Use Topics  . Smoking status: Never Smoker   . Smokeless tobacco: Never Used  . Alcohol Use: Yes   OB History   Grav Para Term Preterm Abortions TAB SAB Ect Mult Living                 Review of Systems  Constitutional: Negative for fever, diaphoresis, appetite change, fatigue and unexpected weight change.  HENT:  Positive for neck pain. Negative for mouth sores and neck stiffness.   Eyes: Negative for visual disturbance.  Respiratory: Negative for cough, chest tightness, shortness of breath and wheezing.   Cardiovascular: Negative for chest pain.  Gastrointestinal: Negative for nausea, vomiting, abdominal pain, diarrhea and constipation.  Endocrine: Negative for polydipsia, polyphagia and polyuria.  Genitourinary: Negative for dysuria, urgency, frequency and hematuria.  Musculoskeletal: Positive for back pain. Negative for joint swelling and gait problem.  Skin: Negative for rash.  Allergic/Immunologic: Negative for immunocompromised state.  Neurological: Negative for syncope, weakness, light-headedness, numbness and headaches.       Paresthesias of the L arm  Hematological: Does not bruise/bleed easily.  Psychiatric/Behavioral: Negative for sleep disturbance. The patient is not nervous/anxious.   All other systems reviewed and are negative.    Allergies  Review of patient's allergies indicates no known allergies.  Home Medications   Current Outpatient Rx  Name  Route  Sig  Dispense  Refill  . hydrochlorothiazide (HYDRODIURIL) 25 MG tablet   Oral   Take 12.5 mg by mouth daily.         Marland Kitchen ibuprofen (ADVIL,MOTRIN) 200 MG tablet   Oral   Take 400 mg by mouth every 6 (six) hours as needed for pain.         Marland Kitchen labetalol (NORMODYNE) 300 MG tablet   Oral   Take 750 mg by mouth 2 (two) times daily. Takes 2 1/2 tablets twice daily         .  diazepam (VALIUM) 5 MG tablet   Oral   Take 1 tablet (5 mg total) by mouth every 6 (six) hours as needed for anxiety (spasms).   10 tablet   0   . ibuprofen (ADVIL,MOTRIN) 800 MG tablet   Oral   Take 1 tablet (800 mg total) by mouth 3 (three) times daily.   21 tablet   0   . oxyCODONE-acetaminophen (PERCOCET/ROXICET) 5-325 MG per tablet   Oral   Take 1-2 tablets by mouth every 4 (four) hours as needed for pain.   15 tablet   0    BP  182/86  Pulse 75  Temp(Src) 98.8 F (37.1 C) (Oral)  Resp 21  SpO2 96% Physical Exam  Nursing note and vitals reviewed. Constitutional: She is oriented to person, place, and time. She appears well-developed and well-nourished. No distress.  Awake, alert, nontoxic appearance, obese  HENT:  Head: Normocephalic and atraumatic.  Mouth/Throat: Oropharynx is clear and moist. No oropharyngeal exudate.  Eyes: Conjunctivae and EOM are normal. Pupils are equal, round, and reactive to light. No scleral icterus.  Neck: Neck supple. Muscular tenderness (L paraspinal) present. No spinous process tenderness present. No rigidity. Decreased range of motion: mild, 2/2 pain.    No midline tenderness  Cardiovascular: Normal rate, regular rhythm, normal heart sounds and intact distal pulses.   No murmur heard. Pulmonary/Chest: Effort normal and breath sounds normal. No respiratory distress. She has no wheezes.  Abdominal: Soft. Bowel sounds are normal. She exhibits no distension and no mass. There is no tenderness. There is no rebound and no guarding.  Musculoskeletal: Normal range of motion. She exhibits no edema.       Cervical back: She exhibits tenderness, pain and spasm. She exhibits no bony tenderness, no swelling, no edema, no deformity and no laceration.       Lumbar back: She exhibits tenderness (mild), pain and spasm.       Back:  baseline range of motion of the T-spine and L-spine No tenderness to palpation of the spinous processes of the T-spine or L-spine; no midline tenderness Mild tenderness to palpation of the bilateral paraspinous muscles of the L-spine  Lymphadenopathy:    She has no cervical adenopathy.  Neurological: She is alert and oriented to person, place, and time. She has normal reflexes. No cranial nerve deficit. She exhibits normal muscle tone. Coordination normal.  Speech is clear and goal oriented, follows commands Normal strength in upper and lower extremities bilaterally  including dorsiflexion and plantar flexion, strong and equal grip strength Sensation normal to light and sharp touch Moves extremities without ataxia, coordination intact Normal gait Normal balance   Skin: Skin is warm and dry. No rash noted. She is not diaphoretic. No erythema.  Psychiatric: She has a normal mood and affect.    ED Course  Procedures (including critical care time) Labs Review Labs Reviewed  CBC - Abnormal; Notable for the following:    WBC 12.8 (*)    Hemoglobin 10.6 (*)    HCT 32.6 (*)    MCV 72.8 (*)    MCH 23.7 (*)    RDW 18.2 (*)    All other components within normal limits  BASIC METABOLIC PANEL - Abnormal; Notable for the following:    Potassium 3.1 (*)    Glucose, Bld 146 (*)    GFR calc non Af Amer 72 (*)    GFR calc Af Amer 83 (*)    All other components within normal limits  TROPONIN  I   Imaging Review No results found.  ECG:  Date: 09/04/2013  Rate: 87  Rhythm: normal sinus rhythm  QRS Axis: normal  Intervals: QT prolonged  ST/T Wave abnormalities: nonspecific T wave changes  Conduction Disutrbances:none  Narrative Interpretation: Unchanged from narrative interpretation on 11/30/2010; T wave flattening in V6  Old EKG Reviewed: unchanged    MDM   1. Cervical radiculopathy   2. Cervical paraspinal muscle spasm   3. Trapezius muscle spasm   4. Chronic low back pain    ALASHA MCGUINNESS presents with left arm muscle spasm. No acute exacerbation of patient's chronic neck and back pain. She denies falling, hitting her head or loss of consciousness. No known trauma to her neck or back. Believes this is from "sleeping wrong."  Pt with neck pain that is radiating down arm. There has been no recent trauma and imaging is not indicated at this time.  No evidence of cervical nerve root compression on physical exam; normal neurologic exam.   Patient evaluated for ACS - troponin negative, ECG nonischemic. CBC with mild leukocytosis to 12.8,  nonspecific. Patient is afebrile, non-tachycardic. She denies history of IV drug use or cancer.  Patient with significant improvement in both pain and paresthesias after Valium administration.    DC w conservative home therapies (ie heat), ibuprofen, Valium and Percocet.  Advised f-u w PCP this week and/or orthopedics if s/s persist.   It has been determined that no acute conditions requiring further emergency intervention are present at this time. The patient/guardian have been advised of the diagnosis and plan. We have discussed signs and symptoms that warrant return to the ED, such as changes or worsening in symptoms.   Vital signs are stable at discharge.   BP 182/86  Pulse 75  Temp(Src) 98.8 F (37.1 C) (Oral)  Resp 21  SpO2 96%  Patient/guardian has voiced understanding and agreed to follow-up with the PCP or specialist.           Dierdre Forth, PA-C 09/04/13 [phone]

## 2013-09-03 NOTE — ED Notes (Signed)
Patient has left shoulder and neck pain with numbness and tingling to that arm. Denies any injury. She states the neck pain started today and the shoulder pain started yesterday.

## 2013-09-03 NOTE — ED Notes (Signed)
The pt is c/o neck lt shoulder pain with lt finger numbness since yesterday .  She has also had dizziness crying in triage

## 2013-09-04 MED ORDER — OXYCODONE-ACETAMINOPHEN 5-325 MG PO TABS: 1.0000 | ORAL_TABLET | ORAL | Status: DC | PRN

## 2013-09-04 MED ORDER — DIAZEPAM 5 MG PO TABS
10.0000 mg | ORAL_TABLET | Freq: Once | ORAL | Status: AC
  Administered 2013-09-04: 10 mg via ORAL
  Filled 2013-09-04: qty 2

## 2013-09-04 MED ORDER — DIAZEPAM 5 MG PO TABS: 5.0000 mg | ORAL_TABLET | Freq: Four times a day (QID) | ORAL | Status: DC | PRN

## 2013-09-04 MED ORDER — OXYCODONE-ACETAMINOPHEN 5-325 MG PO TABS
2.0000 | ORAL_TABLET | Freq: Once | ORAL | Status: AC
  Administered 2013-09-04: 2 via ORAL
  Filled 2013-09-04: qty 2

## 2013-09-04 MED ORDER — IBUPROFEN 800 MG PO TABS: 800.0000 mg | ORAL_TABLET | Freq: Three times a day (TID) | ORAL | Status: DC

## 2013-09-04 NOTE — ED Provider Notes (Signed)
Medical screening examination/treatment/procedure(s) were performed by non-physician practitioner and as supervising physician I was immediately available for consultation/collaboration.   Faline Langer, MD 09/04/13 0653 

## 2013-09-10 ENCOUNTER — Ambulatory Visit (INDEPENDENT_AMBULATORY_CARE_PROVIDER_SITE_OTHER): Payer: 59 | Admitting: Family Medicine

## 2013-09-10 ENCOUNTER — Encounter: Payer: Self-pay | Admitting: Family Medicine

## 2013-09-10 VITALS — BP 130/82 | HR 81 | Temp 98.8°F | Wt 371.0 lb

## 2013-09-10 DIAGNOSIS — I10 Essential (primary) hypertension: Secondary | ICD-10-CM

## 2013-09-10 DIAGNOSIS — F329 Major depressive disorder, single episode, unspecified: Secondary | ICD-10-CM

## 2013-09-10 DIAGNOSIS — M542 Cervicalgia: Secondary | ICD-10-CM

## 2013-09-10 DIAGNOSIS — F32A Depression, unspecified: Secondary | ICD-10-CM | POA: Insufficient documentation

## 2013-09-10 MED ORDER — METHOCARBAMOL 750 MG PO TABS: 750.0000 mg | ORAL_TABLET | Freq: Four times a day (QID) | ORAL | Status: DC

## 2013-09-10 MED ORDER — TRAMADOL HCL 50 MG PO TABS: 50.0000 mg | ORAL_TABLET | Freq: Three times a day (TID) | ORAL | Status: DC | PRN

## 2013-09-10 MED ORDER — NAPROXEN 500 MG PO TABS: 500.0000 mg | ORAL_TABLET | Freq: Two times a day (BID) | ORAL | Status: AC

## 2013-09-10 MED ORDER — LABETALOL HCL 300 MG PO TABS: 750.0000 mg | ORAL_TABLET | Freq: Two times a day (BID) | ORAL | Status: DC

## 2013-09-10 MED ORDER — VENLAFAXINE HCL ER 37.5 MG PO CP24: 37.5000 mg | ORAL_CAPSULE | Freq: Every day | ORAL | Status: DC

## 2013-09-10 MED ORDER — HYDROCHLOROTHIAZIDE 25 MG PO TABS: 12.5000 mg | ORAL_TABLET | Freq: Every day | ORAL | Status: DC

## 2013-09-10 NOTE — Patient Instructions (Addendum)
Follow up in 4-6 weeks to recheck mood Start the Effexor daily in the AM Use the Naproxen twice daily x7-10 days and then as needed- take w/ food (for inflammation) Use the Robaxin for spasm Use the Tramadol for pain as needed Continue the BP meds Call with any questions or concerns Hang in there!!!

## 2013-09-10 NOTE — Progress Notes (Signed)
  Subjective:    Patient ID: Margaret Barron, female    DOB: 03/13/1966, 47 y.o.   MRN: [id number]  HPI ER f/u- went 9/17  b/c L neck was hurting and she was having 'pin-pricks' in her fingers.  Was dx'd w/ pinched nerve and back spasms.  Has hx of similar but sxs are progressing.  Was started on 800mg  Ibuprofen, valium, and pain meds.  Valium has helped relax muscles, percocet is too strong for pt to work and ibuprofen is not strong enough for pt to work.  HTN- chronic problem, needs refills on HCTZ and labetalol.  No CP.  + SOB w/ exertion.  No HAs, visual changes, edema.  + fatigue, weight gain (30 lbs)  Depression- reports her moods are labile.  Tearful.  Increased sleep but not good sleep.  No hx of anxiety or depression.  No family hx.  Friends have commented that pt needs to see someone.  sxs started at beginning of year.  Withdrawing from people and activities that she enjoys.   Review of Systems For ROS see HPI     Objective:   Physical Exam  Vitals reviewed. Constitutional: She is oriented to person, place, and time. She appears well-developed and well-nourished. No distress.  obese  HENT:  Head: Normocephalic and atraumatic.  Eyes: Conjunctivae and EOM are normal. Pupils are equal, round, and reactive to light.  Neck: No thyromegaly present.  L trap spasm Painful rotation of neck  Cardiovascular: Normal rate, regular rhythm, normal heart sounds and intact distal pulses.   No murmur heard. Pulmonary/Chest: Effort normal and breath sounds normal. No respiratory distress.  Abdominal: Soft. She exhibits no distension. There is no tenderness.  Musculoskeletal: She exhibits no edema.  Lymphadenopathy:    She has no cervical adenopathy.  Neurological: She is alert and oriented to person, place, and time. She has normal reflexes. No cranial nerve deficit. Coordination normal.  Skin: Skin is warm and dry.  Psychiatric: Her behavior is normal.  Tearful, withdrawn           Assessment & Plan:

## 2013-09-14 NOTE — Assessment & Plan Note (Signed)
New to provider.  Moderate to severe.  Pt withdrawing from people, activities.  Start SNRI for added energy.  Reviewed supportive care and red flags that should prompt return.  Pt expressed understanding and is in agreement w/ plan.

## 2013-09-14 NOTE — Assessment & Plan Note (Signed)
New to provider, ongoing for pt.  Worsening.  Refer to ortho for complete evaluation and tx.  Start Tramadol for pain as pt finds percocet too strong but Ibuprofen not strong enough.  Flexeril as needed for spasm.  Reviewed supportive care and red flags that should prompt return.  Pt expressed understanding and is in agreement w/ plan.

## 2013-09-14 NOTE — Assessment & Plan Note (Signed)
Chronic problem.  Adequate control.  Asymptomatic.  No med changes. 

## 2013-10-10 ENCOUNTER — Encounter: Payer: Self-pay | Admitting: Lab

## 2013-10-13 ENCOUNTER — Ambulatory Visit: Payer: 59 | Admitting: Family Medicine

## 2013-10-13 DIAGNOSIS — Z0289 Encounter for other administrative examinations: Secondary | ICD-10-CM

## 2016-10-10 ENCOUNTER — Encounter (HOSPITAL_COMMUNITY): Payer: Self-pay

## 2016-10-10 ENCOUNTER — Emergency Department (HOSPITAL_COMMUNITY)
Admission: EM | Admit: 2016-10-10 | Discharge: 2016-10-10 | Disposition: A | Discharge status: Home / Self Care | Payer: 59 | Attending: Emergency Medicine | Admitting: Emergency Medicine

## 2016-10-10 DIAGNOSIS — M545 Low back pain, unspecified: Secondary | ICD-10-CM

## 2016-10-10 DIAGNOSIS — I10 Essential (primary) hypertension: Secondary | ICD-10-CM | POA: Diagnosis not present

## 2016-10-10 DIAGNOSIS — M546 Pain in thoracic spine: Secondary | ICD-10-CM | POA: Diagnosis not present

## 2016-10-10 MED ORDER — OXYCODONE-ACETAMINOPHEN 5-325 MG PO TABS: 1.0000 | ORAL_TABLET | ORAL | 0 refills | Status: DC | PRN

## 2016-10-10 MED ORDER — CYCLOBENZAPRINE HCL 10 MG PO TABS: 10.0000 mg | ORAL_TABLET | Freq: Two times a day (BID) | ORAL | 0 refills | Status: DC | PRN

## 2016-10-10 NOTE — ED Provider Notes (Signed)
[facility]-EMERGENCY DEPT Provider Note   CSN: [id number] Arrival date & time: 10/10/16  1639  By signing my name below, I, Margaret Barron, attest that this documentation has been prepared under the direction and in the presence of Eyvonne Mechanic, PA-C  Electronically Signed: Octavia Barron, ED Scribe. 10/10/16. 5:31 PM.    History   Chief Complaint Chief Complaint  Patient presents with  . Back Pain    The history is provided by the patient. No language interpreter was used.   HPI Comments: Margaret Barron is a 50 y.o. female who has a PMhx of HTN presents to the Emergency Department complaining of acute, onchronic, gradual worsening, lower back pain x 4 days. Pt has been having lower back pain for ~ 3 years but states it became gradually worse over the past 4 days. Pt notes sitting down for most of her job. She reports prolonged sitting and standing increases her pain. She has not been evaluated by a specialist for her back pain. Denies abdominal pain, urinary retention, fever, numbness or tingling in legs, or hx of IV drug use.  Past Medical History:  Diagnosis Date  . Anemia   . Hypertension   . Morbid obesity [facility])     Patient Active Problem List   Diagnosis Date Noted  . Depression 09/10/2013  . Neck pain on left side 09/10/2013  . Sinusitis 04/18/2012  . Hypokalemia 04/17/2012  . Bronchitis 04/17/2012  . ANEMIA, IRON DEFICIENCY, MICROCYTIC 11/30/2009  . OBESITY, MORBID 11/29/2009  . HYPERTENSION 11/29/2009  . LOW BACK PAIN, ACUTE 11/29/2009    Past Surgical History:  Procedure Laterality Date  . TRACHEOSTOMY      OB History    No data available       Home Medications    Prior to Admission medications   Medication Sig Start Date End Date Taking? Authorizing Provider  cyclobenzaprine (FLEXERIL) 10 MG tablet Take 1 tablet (10 mg total) by mouth 2 (two) times daily as needed for muscle spasms. 10/10/16   Eyvonne Mechanic, PA-C  hydrochlorothiazide (HYDRODIURIL)  25 MG tablet Take 0.5 tablets (12.5 mg total) by mouth daily. 09/10/13   Sheliah Hatch, MD  labetalol (NORMODYNE) 300 MG tablet Take 2.5 tablets (750 mg total) by mouth 2 (two) times daily. Takes 2 1/2 tablets twice daily 09/10/13   Sheliah Hatch, MD  methocarbamol (ROBAXIN) 750 MG tablet Take 1 tablet (750 mg total) by mouth 4 (four) times daily. 09/10/13   Sheliah Hatch, MD  oxyCODONE-acetaminophen (PERCOCET/ROXICET) 5-325 MG tablet Take 1-2 tablets by mouth every 4 (four) hours as needed for severe pain. 10/10/16   Eyvonne Mechanic, PA-C  traMADol (ULTRAM) 50 MG tablet Take 1 tablet (50 mg total) by mouth every 8 (eight) hours as needed for pain. 09/10/13   Sheliah Hatch, MD  venlafaxine XR (EFFEXOR-XR) 37.5 MG 24 hr capsule Take 1 capsule (37.5 mg total) by mouth daily. 09/10/13   Sheliah Hatch, MD    Family History Family History  Problem Relation Age of Onset  . Hypertension Mother   . Hypertension Father     Social History Social History  Substance Use Topics  . Smoking status: Never Smoker  . Smokeless tobacco: Never Used  . Alcohol use Yes     Allergies   Review of patient's allergies indicates no known allergies.   Review of Systems Review of Systems  Constitutional: Negative for fever.  Gastrointestinal: Negative for abdominal pain.  Genitourinary: Negative for decreased urine  volume.  Musculoskeletal: Positive for back pain.  Neurological: Negative for weakness and numbness.  All other systems reviewed and are negative.    Physical Exam Updated Vital Signs BP (!) 213/93 (BP Location: Right Arm)   Pulse 76   Temp 98 F (36.7 C) (Oral)   Resp 20   Ht 5\' 11"  (1.803 m)   Wt (!) 156.5 kg   LMP 01/11/2016   SpO2 100%   BMI 48.12 kg/m   Physical Exam  Constitutional: She is oriented to person, place, and time. She appears well-developed and well-nourished. No distress.  HENT:  Head: Normocephalic.  Neck: Normal range of motion. Neck  supple.  Pulmonary/Chest: Effort normal.  Musculoskeletal: Normal range of motion. She exhibits tenderness. She exhibits no edema.  TTP of lower thoracic and diffuse lumbar musculature and soft tissue. No C, T, or L spine tenderness to palpation. No obvious signs of trauma, deformity, infection, step-offs. Lung expansion normal. No scoliosis or kyphosis. Bilateral lower extremity strength 5 out of 5, sensation grossly intact, Refill less than 3 seconds.  Neurological: She is alert and oriented to person, place, and time.  Skin: Skin is warm and dry. She is not diaphoretic.  Psychiatric: She has a normal mood and affect. Her behavior is normal. Judgment and thought content normal.  Nursing note and vitals reviewed.    ED Treatments / Results  DIAGNOSTIC STUDIES: Oxygen Saturation is 100% on RA, normal by my interpretation.  COORDINATION OF CARE:  5:24 PM Discussed treatment plan with pt at bedside and pt agreed to plan.  Labs (all labs ordered are listed, but only abnormal results are displayed) Labs Reviewed - No data to display  EKG  EKG Interpretation None       Radiology No results found.  Procedures Procedures (including critical care time)  Medications Ordered in ED Medications - No data to display   Initial Impression / Assessment and Plan / ED Course  I have reviewed the triage vital signs and the nursing notes.  Pertinent labs & imaging results that were available during my care of the patient were reviewed by me and considered in my medical decision making (see chart for details).  Clinical Course     Final Clinical Impressions(s) / ED Diagnoses   Final diagnoses:  Acute left-sided low back pain without sciatica   Labs:  Imaging:  Consults:  Therapeutics:  Discharge Meds: Naproxen, Flexeril and Percocet.  Assessment/Plan:Acute on chronic back pain with no red flags. Pt was advised to take anti-inflammatory and muscle relaxant to relieve her pain  as well as follow back-exercises that were given. If pain persists, then pt was advised to take the percocet only when needed. A follow-up with a specialist or her PCP was highly recommended for the continuation of her care. Pt was informed to come back to ED if she had any alarming symptoms that were verbalized.  I personally performed the services described in this documentation, which was scribed in my presence. The recorded information has been reviewed and is accurate.  New Prescriptions Discharge Medication List as of 10/10/2016  5:31 PM    START taking these medications   Details  cyclobenzaprine (FLEXERIL) 10 MG tablet Take 1 tablet (10 mg total) by mouth 2 (two) times daily as needed for muscle spasms., Starting Tue 10/10/2016, Print    oxyCODONE-acetaminophen (PERCOCET/ROXICET) 5-325 MG tablet Take 1-2 tablets by mouth every 4 (four) hours as needed for severe pain., Starting Tue 10/10/2016, Print  Eyvonne Mechanic, PA-C 10/10/16 2110    Raeford Razor, MD 10/11/16 [phone]

## 2016-10-10 NOTE — Discharge Instructions (Signed)
Please read attached information. If you experience any new or worsening signs or symptoms please return to the emergency room for evaluation. Please follow-up with your primary care provider or specialist as discussed. Please use medication prescribed only as directed and discontinue taking if you have any concerning signs or symptoms.   °

## 2016-10-10 NOTE — ED Triage Notes (Signed)
PT C/O LOWER BACK PAIN X2 YEARS, BUT WORSE X4 DAYS. PT DENIES INJURY, NUMBNESS, TINGLING, OR URINARY SYMPTOMS.

## 2018-03-30 ENCOUNTER — Other Ambulatory Visit: Payer: Self-pay

## 2018-03-30 ENCOUNTER — Emergency Department (HOSPITAL_COMMUNITY): Payer: 59

## 2018-03-30 ENCOUNTER — Emergency Department (HOSPITAL_COMMUNITY)
Admission: EM | Admit: 2018-03-30 | Discharge: 2018-03-31 | Disposition: A | Discharge status: Home / Self Care | Payer: 59 | Attending: Emergency Medicine | Admitting: Emergency Medicine

## 2018-03-30 ENCOUNTER — Encounter (HOSPITAL_COMMUNITY): Payer: Self-pay | Admitting: Emergency Medicine

## 2018-03-30 ENCOUNTER — Emergency Department (INDEPENDENT_AMBULATORY_CARE_PROVIDER_SITE_OTHER)
Admission: EM | Admit: 2018-03-30 | Discharge: 2018-03-30 | Disposition: A | Discharge status: Other Acute Inpatient Hospital | Payer: 59 | Source: Home / Self Care | Attending: Emergency Medicine | Admitting: Emergency Medicine

## 2018-03-30 DIAGNOSIS — M549 Dorsalgia, unspecified: Secondary | ICD-10-CM | POA: Diagnosis not present

## 2018-03-30 DIAGNOSIS — R9431 Abnormal electrocardiogram [ECG] [EKG]: Secondary | ICD-10-CM

## 2018-03-30 DIAGNOSIS — R030 Elevated blood-pressure reading, without diagnosis of hypertension: Secondary | ICD-10-CM | POA: Diagnosis not present

## 2018-03-30 DIAGNOSIS — I10 Essential (primary) hypertension: Secondary | ICD-10-CM | POA: Diagnosis not present

## 2018-03-30 DIAGNOSIS — R079 Chest pain, unspecified: Secondary | ICD-10-CM | POA: Insufficient documentation

## 2018-03-30 DIAGNOSIS — M546 Pain in thoracic spine: Secondary | ICD-10-CM | POA: Insufficient documentation

## 2018-03-30 DIAGNOSIS — Z79899 Other long term (current) drug therapy: Secondary | ICD-10-CM | POA: Diagnosis not present

## 2018-03-30 DIAGNOSIS — M25512 Pain in left shoulder: Secondary | ICD-10-CM | POA: Diagnosis not present

## 2018-03-30 DIAGNOSIS — M5489 Other dorsalgia: Secondary | ICD-10-CM | POA: Diagnosis not present

## 2018-03-30 LAB — I-STAT BETA HCG BLOOD, ED (MC, WL, AP ONLY): I-stat hCG, quantitative: <5 m[IU]/mL (ref <5)

## 2018-03-30 LAB — BASIC METABOLIC PANEL
Anion gap: 9 (ref 5–15)
BUN: 18 mg/dL (ref 6–20)
CO2: 26 mmol/L (ref 22–32)
Calcium: 9.3 mg/dL (ref 8.9–10.3)
Chloride: 104 mmol/L (ref 101–111)
Creatinine, Ser: 1.08 mg/dL — ABNORMAL HIGH (ref 0.44–1.00)
GFR calc Af Amer: >60 mL/min (ref >60)
GFR, EST NON AFRICAN AMERICAN: 58 mL/min — AB (ref >60)
GLUCOSE: 108 mg/dL — AB (ref 65–99)
POTASSIUM: 4.3 mmol/L (ref 3.5–5.1)
Sodium: 139 mmol/L (ref 135–145)

## 2018-03-30 LAB — CBC
HEMATOCRIT: 38.1 % (ref 36.0–46.0)
Hemoglobin: 12.3 g/dL (ref 12.0–15.0)
MCH: 24 pg — AB (ref 26.0–34.0)
MCHC: 32.3 g/dL (ref 30.0–36.0)
MCV: 74.4 fL — AB (ref 78.0–100.0)
Platelets: 227 10*3/uL (ref 150–400)
RBC: 5.12 MIL/uL — ABNORMAL HIGH (ref 3.87–5.11)
RDW: 16.8 % — AB (ref 11.5–15.5)
WBC: 11.9 10*3/uL — ABNORMAL HIGH (ref 4.0–10.5)

## 2018-03-30 LAB — I-STAT TROPONIN, ED
TROPONIN I, POC: 0.02 ng/mL (ref 0.00–0.08)
Troponin i, poc: 0 ng/mL (ref 0.00–0.08)

## 2018-03-30 MED ORDER — IOPAMIDOL (ISOVUE-370) INJECTION 76%
INTRAVENOUS | Status: AC
  Filled 2018-03-30: qty 100

## 2018-03-30 MED ORDER — ASPIRIN 81 MG PO CHEW
324.0000 mg | CHEWABLE_TABLET | Freq: Once | ORAL | Status: AC
  Administered 2018-03-30: 324 mg via ORAL

## 2018-03-30 MED ORDER — IOPAMIDOL (ISOVUE-370) INJECTION 76%
100.0000 mL | Freq: Once | INTRAVENOUS | Status: AC | PRN
  Administered 2018-03-30: 100 mL via INTRAVENOUS

## 2018-03-30 MED ORDER — CYCLOBENZAPRINE HCL 10 MG PO TABS
10.0000 mg | ORAL_TABLET | Freq: Once | ORAL | Status: AC
Start: 2018-03-30 — End: 2018-03-30
  Administered 2018-03-30: 10 mg via ORAL
  Filled 2018-03-30: qty 1

## 2018-03-30 MED ORDER — NITROGLYCERIN 0.4 MG SL SUBL
0.4000 mg | SUBLINGUAL_TABLET | Freq: Once | SUBLINGUAL | Status: AC
  Administered 2018-03-30: 0.4 mg via SUBLINGUAL

## 2018-03-30 MED ORDER — MORPHINE SULFATE (PF) 4 MG/ML IV SOLN
4.0000 mg | Freq: Once | INTRAVENOUS | Status: AC
  Administered 2018-03-30: 4 mg via INTRAVENOUS
  Filled 2018-03-30: qty 1

## 2018-03-30 NOTE — ED Provider Notes (Addendum)
[facility] EMERGENCY DEPARTMENT Provider Note   CSN: [id number] Arrival date & time: 03/30/18  1909     History   Chief Complaint Chief Complaint  Patient presents with  . Back Pain    HPI Margaret Barron is a 52 y.o. female.  Patient with history of high blood pressure compliant with medications presents from urgent care for further workup of abnormal EKG. Patient's had left mid thoracic back pain for 2-3 days worse with movement however also constant ache. Mild radiation up into the left  Shoulder region posteriorly and mild pulsatile sensation. Patient denies any cardiac history. Before this event no injuries and no chest pain with exertion.patient denies recent surgeryor exertional symptoms. No blood clot history. No unilateral leg swelling.     Past Medical History:  Diagnosis Date  . Anemia   . Hypertension   . Morbid obesity [facility])     Patient Active Problem List   Diagnosis Date Noted  . Depression 09/10/2013  . Neck pain on left side 09/10/2013  . Sinusitis 04/18/2012  . Hypokalemia 04/17/2012  . Bronchitis 04/17/2012  . ANEMIA, IRON DEFICIENCY, MICROCYTIC 11/30/2009  . OBESITY, MORBID 11/29/2009  . HYPERTENSION 11/29/2009  . LOW BACK PAIN, ACUTE 11/29/2009    Past Surgical History:  Procedure Laterality Date  . TRACHEOSTOMY       OB History   None      Home Medications    Prior to Admission medications   Medication Sig Start Date End Date Taking? Authorizing Provider  cyclobenzaprine (FLEXERIL) 10 MG tablet Take 1 tablet (10 mg total) by mouth 2 (two) times daily as needed for muscle spasms. 10/10/16   Hedges, Tinnie Gens, PA-C  hydrochlorothiazide (HYDRODIURIL) 25 MG tablet Take 0.5 tablets (12.5 mg total) by mouth daily. 09/10/13   Sheliah Hatch, MD  labetalol (NORMODYNE) 300 MG tablet Take 2.5 tablets (750 mg total) by mouth 2 (two) times daily. Takes 2 1/2 tablets twice daily 09/10/13   Sheliah Hatch, MD    methocarbamol (ROBAXIN) 750 MG tablet Take 1 tablet (750 mg total) by mouth 4 (four) times daily. 09/10/13   Sheliah Hatch, MD  oxyCODONE-acetaminophen (PERCOCET/ROXICET) 5-325 MG tablet Take 1-2 tablets by mouth every 4 (four) hours as needed for severe pain. 10/10/16   Hedges, Tinnie Gens, PA-C  traMADol (ULTRAM) 50 MG tablet Take 1 tablet (50 mg total) by mouth every 8 (eight) hours as needed for pain. 09/10/13   Sheliah Hatch, MD  venlafaxine XR (EFFEXOR-XR) 37.5 MG 24 hr capsule Take 1 capsule (37.5 mg total) by mouth daily. 09/10/13   Sheliah Hatch, MD    Family History Family History  Problem Relation Age of Onset  . Hypertension Mother   . Hypertension Father     Social History Social History   Tobacco Use  . Smoking status: Never Smoker  . Smokeless tobacco: Never Used  Substance Use Topics  . Alcohol use: Yes  . Drug use: Not on file     Allergies   Patient has no known allergies.   Review of Systems Review of Systems  Constitutional: Negative for chills and fever.  HENT: Negative for congestion.   Eyes: Negative for visual disturbance.  Respiratory: Negative for shortness of breath.   Cardiovascular: Negative for chest pain.  Gastrointestinal: Negative for abdominal pain and vomiting.  Genitourinary: Negative for dysuria and flank pain.  Musculoskeletal: Positive for back pain. Negative for neck pain and neck stiffness.  Skin: Negative for  rash.  Neurological: Negative for light-headedness and headaches.     Physical Exam Updated Vital Signs BP (!) 190/82   Pulse 77   Temp 98.5 F (36.9 C) (Oral)   Resp (!) 21   LMP 01/11/2016   SpO2 97%   Physical Exam  Constitutional: She is oriented to person, place, and time. She appears well-developed and well-nourished.  HENT:  Head: Normocephalic and atraumatic.  Eyes: Conjunctivae are normal. Right eye exhibits no discharge. Left eye exhibits no discharge.  Neck: Normal range of motion. Neck  supple. No tracheal deviation present.  Cardiovascular: Normal rate, regular rhythm and intact distal pulses.  Pulmonary/Chest: Effort normal and breath sounds normal.  Abdominal: Soft. She exhibits no distension. There is no tenderness. There is no guarding.  Musculoskeletal: She exhibits tenderness. She exhibits no edema.  Patient has tenderness to palpation left mid thoracic region no midline.  Neurological: She is alert and oriented to person, place, and time. She has normal strength. No cranial nerve deficit. GCS eye subscore is 4. GCS verbal subscore is 5. GCS motor subscore is 6.  Skin: Skin is warm. No rash noted.  Psychiatric: She has a normal mood and affect.  Nursing note and vitals reviewed.    ED Treatments / Results  Labs (all labs ordered are listed, but only abnormal results are displayed) Labs Reviewed  BASIC METABOLIC PANEL - Abnormal; Notable for the following components:      Result Value   Glucose, Bld 108 (*)    Creatinine, Ser 1.08 (*)    GFR calc non Af Amer 58 (*)    All other components within normal limits  CBC - Abnormal; Notable for the following components:   WBC 11.9 (*)    RBC 5.12 (*)    MCV 74.4 (*)    MCH 24.0 (*)    RDW 16.8 (*)    All other components within normal limits  I-STAT TROPONIN, ED  I-STAT BETA HCG BLOOD, ED ([facility], [facility], [facility] ONLY)  I-STAT TROPONIN, ED    EKG None Hr 81, non specific T wave inversions lateral, sinus, normal qt, no ST elevation Radiology No results found. Dg Chest 2 View  Result Date: 03/30/2018 CLINICAL DATA:  Low back pain radiating up LEFT side into shoulder blade region, EKG changes, history hypertension EXAM: CHEST - 2 VIEW COMPARISON:  04/16/2012 FINDINGS: Borderline enlargement of cardiac silhouette. Mediastinal contours and pulmonary vascularity normal. Lungs clear. No pleural effusion or pneumothorax. Bones unremarkable. IMPRESSION: Borderline enlargement of cardiac silhouette without acute infiltrate.  Electronically Signed   By: Ulyses Southward M.D.   On: 03/30/2018 19:40   Ct Angio Chest/abd/pel For Dissection W And/or Wo Contrast  Result Date: 03/31/2018 CLINICAL DATA:  Chest/back pain x3 days EXAM: CT ANGIOGRAPHY CHEST, ABDOMEN AND PELVIS TECHNIQUE: Multidetector CT imaging through the chest, abdomen and pelvis was performed using the standard protocol during bolus administration of intravenous contrast. Multiplanar reconstructed images and MIPs were obtained and reviewed to evaluate the vascular anatomy. CONTRAST:  ISOVUE-370 IOPAMIDOL (ISOVUE-370) INJECTION 76% COMPARISON:  CT abdomen/pelvis dated 11/19/2009 FINDINGS: CTA CHEST FINDINGS Cardiovascular: On unenhanced CT, there is no evidence of intramural hematoma. Preferential opacification of the thoracic aorta. No evidence of thoracic aortic aneurysm or dissection. The heart is normal in size.  No pericardial effusion. No evidence of central pulmonary embolism. Mediastinum/Nodes: No suspicious mediastinal lymphadenopathy. Visualized thyroid is notable for a dominant 2.1 cm right thyroid nodule. Lungs/Pleura: No suspicious pulmonary nodules. Scattered lung cysts. No  focal consolidation. No pleural effusion or pneumothorax. Musculoskeletal: Degenerative changes of the thoracic spine. Review of the MIP images confirms the above findings. CTA ABDOMEN AND PELVIS FINDINGS VASCULAR Aorta: No evidence abdominal aortic aneurysm or dissection.  Patent. Celiac: Patent. SMA: Patent. Renals: Patent bilaterally. IMA: Poorly opacified but likely patent. Inflow: Patent. Veins: Unremarkable. Review of the MIP images confirms the above findings. NON-VASCULAR Hepatobiliary: Liver is within normal limits. Gallbladder is unremarkable. No intrahepatic or extrahepatic ductal dilatation. Pancreas: Within normal limits. Spleen: Within normal limits. Adrenals/Urinary Tract: Adrenal glands are within normal limits. Kidneys are within normal limits.  No hydronephrosis. Bladder  is within normal limits. Stomach/Bowel: Stomach is within normal limits. No evidence of bowel obstruction. Normal appendix (series 7/image 216). Lymphatic: No suspicious abdominopelvic lymphadenopathy. Reproductive: Uterus is within normal limits. No adnexal masses. Other: No abdominopelvic ascites. Musculoskeletal: Mild degenerative changes of the lumbar spine. Review of the MIP images confirms the above findings. IMPRESSION: No evidence of thoracoabdominal aortic aneurysm or dissection. No evidence of central pulmonary embolism. No CT findings to account for the patient's chest/back pain. Electronically Signed   By: Charline BillsSriyesh  Krishnan M.D.   On: 03/31/2018 00:02   Procedures Procedures (including critical care time)  Medications Ordered in ED Medications  morphine 4 MG/ML injection 4 mg (4 mg Intravenous Given 03/30/18 2252)  cyclobenzaprine (FLEXERIL) tablet 10 mg (10 mg Oral Given 03/30/18 2252)  iopamidol (ISOVUE-370) 76 % injection 100 mL (100 mLs Intravenous Contrast Given 03/30/18 2329)     Initial Impression / Assessment and Plan / ED Course  I have reviewed the triage vital signs and the nursing notes.  Pertinent labs & imaging results that were available during my care of the patient were reviewed by me and considered in my medical decision making (see chart for details).    Patient presents with worsening thoracic back pain radiation to the shoulder and abnormal EKG. Reviewed EKG done at urgent care heart rate 98, LVH, nonspecific ST findings, normal QT sinus.  [facility] 87, sinus, short PR similar to previous, mildT-wave inversions in lateral leads similar to September 2014.Overall no significant changes to September 2014 EKG. Sinus rhythm.  Patient most likely has musculoskeletal presentation however with other concerns plan for delta troponin, CT dissection study and if unremarkable and symptoms controlled outpatient follow-up.  Results and  differential diagnosis were discussed with the patient/parent/guardian. Xrays were independently reviewed by myself.  Close follow up outpatient was discussed, comfortable with the plan.   Medications  morphine 4 MG/ML injection 4 mg (4 mg Intravenous Given 03/30/18 2252)  cyclobenzaprine (FLEXERIL) tablet 10 mg (10 mg Oral Given 03/30/18 2252)  iopamidol (ISOVUE-370) 76 % injection 100 mL (100 mLs Intravenous Contrast Given 03/30/18 2329)    Vitals:   03/31/18 0000 03/31/18 0015 03/31/18 0145 03/31/18 0330  BP: (!) 226/105 (!) 208/102 (!) 215/116 (!) 190/82  Pulse: 84 79 79 77  Resp: 18 (!) 21 14 (!) 21  Temp:      TempSrc:      SpO2: 97% 97% 100% 97%    Final diagnoses:  Acute left-sided thoracic back pain      Final Clinical Impressions(s) / ED Diagnoses   Final diagnoses:  Acute left-sided thoracic back pain    ED Discharge Orders    None       Blane OharaZavitz, Britain Saber, MD 04/01/18 0006    Blane OharaZavitz, Mehek Grega, MD 04/17/18 1500

## 2018-03-30 NOTE — ED Provider Notes (Signed)
[facility]    CSN: [id number] Arrival date & time: 03/30/18  1735     History   Chief Complaint Chief Complaint  Patient presents with  . Back Pain    HPI Margaret Barron is a 52 y.o. female.   HPI 52 year old female, lives in [place], was visiting a relative here in [place]. 3-day history of left mid back pain that feels tight and pressure and worse to move.  However this left mid back pain became severe today 9-1/2 out of 10 intensity.  She denies radiation.  Denies actual chest pain.  Has a vague feeling of shortness of breath.  No cough or fever or chills or nausea or vomiting.  No syncope or lightheadedness.  Denies palpitations. Has not tried any medicine for the pain Past Medical History:  Diagnosis Date  . Anemia   . Hypertension   . Morbid obesity (HCC)   Past medical history of hypertension.  She states she is taking 1 BP med daily, she does not remember the name of the BP med, and she has not seen her PCP in a year and a half.  Patient Active Problem List   Diagnosis Date Noted  . Depression 09/10/2013  . Neck pain on left side 09/10/2013  . Sinusitis 04/18/2012  . Hypokalemia 04/17/2012  . Bronchitis 04/17/2012  . ANEMIA, IRON DEFICIENCY, MICROCYTIC 11/30/2009  . OBESITY, MORBID 11/29/2009  . HYPERTENSION 11/29/2009  . LOW BACK PAIN, ACUTE 11/29/2009    Past Surgical History:  Procedure Laterality Date  . TRACHEOSTOMY    Past surgical history, tracheostomy for a polyp in the past, not an active problem now.  OB History   None      Home Medications    Prior to Admission medications   Medication Sig Start Date End Date Taking? Authorizing Provider  cyclobenzaprine (FLEXERIL) 10 MG tablet Take 1 tablet (10 mg total) by mouth 2 (two) times daily as needed for muscle spasms. 10/10/16   Hedges, Tinnie Gens, PA-C  hydrochlorothiazide (HYDRODIURIL) 25 MG tablet Take 0.5 tablets (12.5 mg total) by mouth daily. 09/10/13   Sheliah Hatch, MD  labetalol (NORMODYNE) 300 MG tablet Take 2.5 tablets (750 mg total) by mouth 2 (two) times daily. Takes 2 1/2 tablets twice daily 09/10/13   Sheliah Hatch, MD  methocarbamol (ROBAXIN) 750 MG tablet Take 1 tablet (750 mg total) by mouth 4 (four) times daily. 09/10/13   Sheliah Hatch, MD  oxyCODONE-acetaminophen (PERCOCET/ROXICET) 5-325 MG tablet Take 1-2 tablets by mouth every 4 (four) hours as needed for severe pain. 10/10/16   Hedges, Tinnie Gens, PA-C  traMADol (ULTRAM) 50 MG tablet Take 1 tablet (50 mg total) by mouth every 8 (eight) hours as needed for pain. 09/10/13   Sheliah Hatch, MD  venlafaxine XR (EFFEXOR-XR) 37.5 MG 24 hr capsule Take 1 capsule (37.5 mg total) by mouth daily. 09/10/13   Sheliah Hatch, MD    Family History Family History  Problem Relation Age of Onset  . Hypertension Mother   . Hypertension Father    Family history, hypertension in both parents Social History Social History   Tobacco Use  . Smoking status: Never Smoker  . Smokeless tobacco: Never Used  Substance Use Topics  . Alcohol use: Yes  . Drug use: Not on file   Non-smoker  Allergies   Patient has no known allergies.   Review of Systems Review of Systems Please see HPI. No fever or chills or nausea or  vomiting.  Physical Exam Triage Vital Signs ED Triage Vitals  Enc Vitals Group     BP 03/30/18 1756 (!) 207/127     Pulse Rate 03/30/18 1756 99     Resp 03/30/18 1756 18     Temp 03/30/18 1756 97.7 F (36.5 C)     Temp Source 03/30/18 1756 Oral     SpO2 03/30/18 1756 100 %     Weight 03/30/18 1757 (!) 352 lb (159.7 kg)     Height 03/30/18 1757 5\' 11"  (1.803 m)     Head Circumference --      Peak Flow --      Pain Score 03/30/18 1757 9     Pain Loc --      Pain Edu? --      Excl. in GC? --    No data found.  Updated Vital Signs BP (!) 207/127 (BP Location: Right Arm)   Pulse 99   Temp 97.7 F (36.5 C) (Oral)   Resp 18   Ht 5\' 11"  (1.803 m)   Wt (!)  352 lb (159.7 kg)   LMP 01/11/2016   SpO2 100%   BMI 49.09 kg/m   Visual Acuity Right Eye Distance:   Left Eye Distance:   Bilateral Distance:    Right Eye Near:   Left Eye Near:    Bilateral Near:     Physical Exam Initial BP 207/127 She is alert, uncomfortable from back pain, lying on stretcher/exam table.  Morbidly obese. Appears mildly dyspneic.  Appears in distress from the pain. Oropharynx clear Neck, nontender, supple, no adenopathy.  Old tracheostomy scar intact. Lungs: Clear to auscultation Heart: Regular rate and rhythm without murmur Abdomen: Nondistended.  Obese Extremities: Nontender.  No pitting edema Back: She points to the left mid back where she is having her pain.  Torsion can slightly exacerbate the pain.  No significant tenderness to palpation. Neuro: Grossly intact   UC Treatments / Results  Labs (all labs ordered are listed, but only abnormal results are displayed) Labs Reviewed - No data to display  EKG None Radiology No results found.  Procedures Procedures (including critical care time)  Medications Ordered in UC Medications  aspirin chewable tablet 324 mg (324 mg Oral Given 03/30/18 1816)  nitroGLYCERIN (NITROSTAT) SL tablet 0.4 mg (0.4 mg Sublingual Given 03/30/18 1817)     Initial Impression / Assessment and Plan / UC Course  EKG: Normal sinus rhythm, ST elevation of 1 mm in V1 and V2 She has severe accelerated hypertension, left posterior thoracic pain and signs of STEMI on EKG.-We called 911 for EMS stat Explained to patient and we immediately gave aspirin 324 mg orally chewed. O2 2 L/min Nitroglycerin 0.4, sublingually. Physician and/or nurse stayed at bedside until EMS arrived Patient rechecked 5 minutes after nitroglycerin, BP right arm 172/106.  When EMS arrived about 10 minutes later, BP by EMS 250 systolic  Patient requested transport to [facility], as she lives in [place] and other family in [place]     I have reviewed the triage vital signs and the nursing notes.  Pertinent labs & imaging results that were available during my care of the patient were reviewed by me and considered in my medical decision making (see chart for details).       Final Clinical Impressions(s) / UC Diagnoses   Final diagnoses:  Acute left-sided thoracic back pain  Accelerated hypertension  ST elevation    ED Discharge Orders    None  Lajean ManesMassey, David, MD 03/30/18 Mikle [id number]

## 2018-03-30 NOTE — ED Triage Notes (Signed)
Per [facility].  Pt from [facility] urgent care in [place].  Seen for lower back pain radiating up left side and shoulder blade.  EKG changes were noted at urgent care and the provider administered 324 ASA and 1 nitro glycerin with no relief. Pt states this has been going on for 3 days.

## 2018-03-30 NOTE — ED Triage Notes (Signed)
Pt c/o back pain x 3 days. Currently at pain level 9.5. Has not taken any meds for relief.

## 2018-04-01 ENCOUNTER — Telehealth: Payer: Self-pay | Admitting: Emergency Medicine

## 2018-04-17 ENCOUNTER — Encounter: Payer: Self-pay | Admitting: Physician Assistant

## 2018-05-08 ENCOUNTER — Encounter: Payer: Self-pay | Admitting: Physician Assistant

## 2018-05-08 ENCOUNTER — Ambulatory Visit (INDEPENDENT_AMBULATORY_CARE_PROVIDER_SITE_OTHER): Payer: 59 | Admitting: Physician Assistant

## 2018-05-08 VITALS — BP 164/88 | HR 78 | Ht 71.0 in | Wt 356.0 lb

## 2018-05-08 DIAGNOSIS — G8929 Other chronic pain: Secondary | ICD-10-CM

## 2018-05-08 DIAGNOSIS — I119 Hypertensive heart disease without heart failure: Secondary | ICD-10-CM | POA: Diagnosis not present

## 2018-05-08 DIAGNOSIS — M546 Pain in thoracic spine: Secondary | ICD-10-CM | POA: Diagnosis not present

## 2018-05-08 LAB — COMPREHENSIVE METABOLIC PANEL
A/G RATIO: 1.7 (ref 1.2–2.2)
ALBUMIN: 4.2 g/dL (ref 3.5–5.5)
ALT: 27 IU/L (ref 0–32)
AST: 21 IU/L (ref 0–40)
Alkaline Phosphatase: 76 IU/L (ref 39–117)
BUN / CREAT RATIO: 14 (ref 9–23)
BUN: 21 mg/dL (ref 6–24)
Bilirubin Total: <0.2 mg/dL (ref 0.0–1.2)
CO2: 23 mmol/L (ref 20–29)
CREATININE: 1.45 mg/dL — AB (ref 0.57–1.00)
Calcium: 9.4 mg/dL (ref 8.7–10.2)
Chloride: 105 mmol/L (ref 96–106)
GFR calc non Af Amer: 41 mL/min/{1.73_m2} — ABNORMAL LOW (ref >59)
GFR, EST AFRICAN AMERICAN: 48 mL/min/{1.73_m2} — AB (ref >59)
GLOBULIN, TOTAL: 2.5 g/dL (ref 1.5–4.5)
Glucose: 127 mg/dL — ABNORMAL HIGH (ref 65–99)
Potassium: 3.8 mmol/L (ref 3.5–5.2)
SODIUM: 145 mmol/L — AB (ref 134–144)
Total Protein: 6.7 g/dL (ref 6.0–8.5)

## 2018-05-08 LAB — LIPID PANEL
Chol/HDL Ratio: 2.4 ratio (ref 0.0–4.4)
Cholesterol, Total: 121 mg/dL (ref 100–199)
HDL: 51 mg/dL (ref >39)
LDL CALC: 55 mg/dL (ref 0–99)
Triglycerides: 76 mg/dL (ref 0–149)
VLDL CHOLESTEROL CAL: 15 mg/dL (ref 5–40)

## 2018-05-08 LAB — TSH: TSH: 1.22 u[IU]/mL (ref 0.450–4.500)

## 2018-05-08 MED ORDER — CARVEDILOL 12.5 MG PO TABS: 12.5000 mg | ORAL_TABLET | Freq: Two times a day (BID) | ORAL | 3 refills | Status: DC

## 2018-05-08 NOTE — Patient Instructions (Addendum)
Medication Instructions:   1. START COREG 12.5 MG  TWO TIMES DAILY  Labwork: TODAY: FASTING LIPIDS, TSH, CMET  Testing/Procedures: Your physician has requested that you have an echocardiogram. Echocardiography is a painless test that uses sound waves to create images of your heart. It provides your doctor with information about the size and shape of your heart and how well your heart's chambers and valves are working. This procedure takes approximately one hour. There are no restrictions for this procedure.    Follow-Up: Your physician recommends that you schedule a follow-up appointment with Tereso Newcomer on June 26 th at 10:15 a.m.   Any Other Special Instructions Will Be Listed Below (If Applicable). Referral has been placed for [facility] [place] primary care. Their office will call you with an appointment.     If you need a refill on your cardiac medications before your next appointment, please call your pharmacy.

## 2018-05-08 NOTE — Progress Notes (Signed)
Cardiology Office Note:    Date:  05/08/2018   ID:  Margaret Barron, DOB 1966/07/14, MRN [id number]  PCP:  Patient, No Pcp Per  Cardiologist:  No primary care provider on file.   Referring MD: No ref. provider found   Chief Complaint  Patient presents with  . High Blood Pressure    History of Present Illness:    Margaret Barron is a 52 y.o. female with hypertension who is being seen today for the evaluation of blood pressure.  Ms. Drury was evaluated in the emergency room 03/30/2018 with increasing thoracic pain and markedly elevated blood pressure.  Chest abdominal CTA was negative for dissection and negative for pulmonary embolism.  Cardiac enzymes are negative.  ECG demonstrated nonspecific ST-T wave changes and was essentially unchanged from prior ECG in 2014.  She is here alone today.  She used to take Labetalol and HCTZ with fairly controlled blood pressure. She is out of all medications and is not currently taking anything.  She denies chest pain, syncope, orthopnea, paroxysmal nocturnal dyspnea, edema.  She does not shortness of breath with more extreme activity. This is overall stable without change.    PAD Screen 05/08/2018  Previous PAD dx? No  Previous surgical procedure? No  Pain with walking? No  Feet/toe relief with dangling? No  Painful, non-healing ulcers? No  Extremities discolored? No    Prior CV studies:   The following studies were reviewed today:  Chest/Abd CTA 03/30/18 IMPRESSION: No evidence of thoracoabdominal aortic aneurysm or dissection. No evidence of central pulmonary embolism. No CT findings to account for the patient's chest/back pain.  CXR 03/30/18 IMPRESSION: Borderline enlargement of cardiac silhouette without acute infiltrate.   Past Medical History:  Diagnosis Date  . Anemia    hx of menorrhagia  . Hypertension   . Morbid obesity (HCC)   . Trachea, stenosis    polyps removed age 63; required tracheostomy    Past Surgical  History:  Procedure Laterality Date  . TRACHEOSTOMY      Current Medications: No outpatient medications have been marked as taking for the 05/08/18 encounter (Office Visit) with Tereso Newcomer T, PA-C.     Allergies:   Patient has no known allergies.   Social History   Socioeconomic History  . Marital status: Single    Spouse name: Not on file  . Number of children: 0  . Years of education: Not on file  . Highest education level: Not on file  Occupational History  . Occupation: Nutritional therapist: [employer]  Social Needs  . Financial resource strain: Not on file  . Food insecurity:    Worry: Not on file    Inability: Not on file  . Transportation needs:    Medical: Not on file    Non-medical: Not on file  Tobacco Use  . Smoking status: Never Smoker  . Smokeless tobacco: Never Used  Substance and Sexual Activity  . Alcohol use: Yes    Alcohol/week: 0.6 oz    Types: 1 Glasses of wine per week  . Drug use: Yes    Types: Marijuana  . Sexual activity: Not on file  Lifestyle  . Physical activity:    Days per week: Not on file    Minutes per session: Not on file  . Stress: Not on file  Relationships  . Social connections:    Talks on phone: Not on file    Gets together: Not on file  Attends religious service: Not on file    Active member of club or organization: Not on file    Attends meetings of clubs or organizations: Not on file    Relationship status: Not on file  Other Topics Concern  . Not on file  Social History Narrative   Grew up in [facility] - Business Degree     Family Hx: The patient's family history includes Atrial fibrillation in her mother; Hypertension in her father and mother. There is no history of Heart attack.  ROS:   Please see the history of present illness.    Review of Systems  Respiratory: Positive for snoring.   Musculoskeletal: Positive for back pain and joint swelling.    Psychiatric/Behavioral: The patient is nervous/anxious.    All other systems reviewed and are negative.   EKGs/Labs/Other Test Reviewed:    EKG:  EKG is  ordered today.  The ekg ordered today demonstrates normal sinus rhythm, heart rate 78, normal axis, LVH, T wave inversions 1, aVL, QTC 462, similar to prior tracings  Recent Labs: 03/30/2018: BUN 18; Creatinine, Ser 1.08; Hemoglobin 12.3; Platelets 227; Potassium 4.3; Sodium 139   Recent Lipid Panel Lab Results  Component Value Date/Time   CHOL 110 11/30/2010 08:45 AM   TRIG 71.0 11/30/2010 08:45 AM   HDL 33.60 (L) 11/30/2010 08:45 AM   CHOLHDL 3 11/30/2010 08:45 AM   LDLCALC 62 11/30/2010 08:45 AM    Physical Exam:    VS:  BP (!) 164/88   Pulse 78   Ht  (1.803 m)   Wt (!) 356 lb (161.5 kg)   LMP 01/11/2016   SpO2 99%   BMI 49.65 kg/m     Wt Readings from Last 3 Encounters:  05/08/18 (!) 356 lb (161.5 kg)  03/30/18 (!) 352 lb (159.7 kg)  10/10/16 (!) 345 lb (156.5 kg)     Physical Exam  Constitutional: She is oriented to person, place, and time. She appears well-developed and well-nourished. No distress.  HENT:  Head: Normocephalic and atraumatic.  Neck: Neck supple. No JVD present. Carotid bruit is not present.  Cardiovascular: Normal rate, regular rhythm, S1 normal and S2 normal.  Murmur heard.  Low-pitched systolic murmur is present with a grade of 2/6 at the upper left sternal border. The intensity does not change with valsalva. Pulmonary/Chest: Effort normal. She has no rales.  Abdominal: Soft. There is no hepatomegaly.  Musculoskeletal: She exhibits no edema.  Neurological: She is alert and oriented to person, place, and time.  Skin: Skin is warm and dry.    ASSESSMENT & PLAN:    Hypertensive Heart Disease without CHF She has uncontrolled blood pressure.  She has evidence of LVH on ECG and cardiomegaly on CXR.  She does not display any signs or symptoms of CHF.  She was previously on Labetalol but  took several pills and did not like this.  She also had pedal edema with Amlodipine.  She was also on HCTZ at one point.  She previously tried to limit BP drugs to medicines that would be safe in pregnancy.  However, she has gone through menopause.    -Start Coreg 12.5 mg Twice daily   -Consider Lisinopril/HCTZ 10/12.5 mg QD if BP remains above target  -Labs today:  CMET, Lipids, TSH  -Obtain Echocardiogram  Back Pain Her thoracic back pain seems to be musculoskeletal.  I will refer her to primary care.  Morbid Obesity BMI > 40.  We  briefly discussed diet and exercise and the effect of weight loss on controlling blood pressure.   Dispo:  Return in about 1 month (around 06/08/2018) for Routine Follow Up, w/ Tereso Newcomer, PA-C.   Medication Adjustments/Labs and Tests Ordered: Current medicines are reviewed at length with the patient today.  Concerns regarding medicines are outlined above.  Orders/Tests:  Orders Placed This Encounter  Procedures  . TSH  . Comprehensive metabolic panel  . Lipid panel  . Ambulatory referral to Internal Medicine  . EKG 12-Lead  . ECHOCARDIOGRAM COMPLETE   Medication changes: Meds ordered this encounter  Medications  . carvedilol (COREG) 12.5 MG tablet    Sig: Take 1 tablet (12.5 mg total) by mouth 2 (two) times daily.    Dispense:  180 tablet    Refill:  3   Signed, Tereso Newcomer, PA-C  05/08/2018 11:08 AM    [facility] [address] [place], [place], [place]  [address] Phone: [phone]; Fax: [phone]

## 2018-05-09 ENCOUNTER — Encounter (INDEPENDENT_AMBULATORY_CARE_PROVIDER_SITE_OTHER): Payer: Self-pay

## 2018-05-10 ENCOUNTER — Other Ambulatory Visit: Payer: Self-pay | Admitting: *Deleted

## 2018-05-10 DIAGNOSIS — R7301 Impaired fasting glucose: Secondary | ICD-10-CM

## 2018-05-10 DIAGNOSIS — I119 Hypertensive heart disease without heart failure: Secondary | ICD-10-CM

## 2018-05-10 NOTE — Progress Notes (Signed)
Notes recorded by Tereso Newcomer T, PA-C on 05/09/2018 at 8:31 AM EDT TSH normal. Glucose elevated. Creatinine elevated. Potassium normal. LFTs normal. Lipids good. PLAN:  1. Hydrate well 2. Avoid NSAIDs 3. Repeat BMET 1 week 4. Obtain hemoglobin A1c with repeat BMET in 1 week Tereso Newcomer, PA-C   05/09/2018 8:27 AM

## 2018-05-16 ENCOUNTER — Other Ambulatory Visit: Payer: Self-pay

## 2018-05-16 ENCOUNTER — Ambulatory Visit (HOSPITAL_COMMUNITY): Payer: 59 | Attending: Cardiovascular Disease

## 2018-05-16 ENCOUNTER — Other Ambulatory Visit: Payer: 59 | Admitting: *Deleted

## 2018-05-16 ENCOUNTER — Encounter: Payer: Self-pay | Admitting: Physician Assistant

## 2018-05-16 ENCOUNTER — Telehealth: Payer: Self-pay | Admitting: *Deleted

## 2018-05-16 DIAGNOSIS — I119 Hypertensive heart disease without heart failure: Secondary | ICD-10-CM

## 2018-05-16 DIAGNOSIS — R7301 Impaired fasting glucose: Secondary | ICD-10-CM | POA: Diagnosis not present

## 2018-05-16 NOTE — Telephone Encounter (Signed)
-----   Message from Beatrice Lecher, [place] sent at 05/16/2018  5:15 PM EDT ----- Please call the patient. The echocardiogram demonstrates normal heart function (ejection fraction).  There is mildly impaired relaxation (diastolic dysfunction).  There is significant thickening of the heart muscle (severe LVH).  These changes are related to uncontrolled blood pressure.  Continue current medications and follow up as planned.  Tereso Newcomer, PA-C    05/16/2018 5:12 PM

## 2018-05-16 NOTE — Telephone Encounter (Signed)
Left message to go over echo results. 

## 2018-05-17 ENCOUNTER — Other Ambulatory Visit: Payer: 59

## 2018-05-17 LAB — BASIC METABOLIC PANEL
BUN / CREAT RATIO: 14 (ref 9–23)
BUN: 16 mg/dL (ref 6–24)
CHLORIDE: 104 mmol/L (ref 96–106)
CO2: 24 mmol/L (ref 20–29)
Calcium: 9.4 mg/dL (ref 8.7–10.2)
Creatinine, Ser: 1.11 mg/dL — ABNORMAL HIGH (ref 0.57–1.00)
GFR calc Af Amer: 66 mL/min/{1.73_m2} (ref >59)
GFR calc non Af Amer: 57 mL/min/{1.73_m2} — ABNORMAL LOW (ref >59)
GLUCOSE: 153 mg/dL — AB (ref 65–99)
POTASSIUM: 4.4 mmol/L (ref 3.5–5.2)
SODIUM: 142 mmol/L (ref 134–144)

## 2018-05-17 LAB — HEMOGLOBIN A1C
Est. average glucose Bld gHb Est-mCnc: 108 mg/dL
HEMOGLOBIN A1C: 5.4 % (ref 4.8–5.6)

## 2018-05-17 NOTE — Telephone Encounter (Signed)
Left message x 2 to go over echo results.

## 2018-05-21 ENCOUNTER — Encounter: Payer: Self-pay | Admitting: General Practice

## 2018-05-21 NOTE — Telephone Encounter (Signed)
-----   Message from Beatrice LecherScott T Weaver, [place] sent at 05/17/2018  1:53 PM EDT ----- Renal function (creatinine) improved.  Glucose (sugar) elevated.  Potassium normal.  Diabetes test (hemoglobin A1c) is normal. Continue current medications and follow up as planned.  Tereso NewcomerScott Weaver, PA-C    05/17/2018 1:52 PM

## 2018-05-21 NOTE — Telephone Encounter (Signed)
Left message x 3 to go over results. Results have been released into MY CHART. If any questions call [phone]

## 2018-06-03 ENCOUNTER — Ambulatory Visit (INDEPENDENT_AMBULATORY_CARE_PROVIDER_SITE_OTHER): Payer: 59 | Admitting: Internal Medicine

## 2018-06-03 ENCOUNTER — Encounter: Payer: Self-pay | Admitting: Internal Medicine

## 2018-06-03 VITALS — BP 146/88 | HR 107 | Temp 97.8°F | Ht 71.0 in | Wt 364.0 lb

## 2018-06-03 DIAGNOSIS — Z23 Encounter for immunization: Secondary | ICD-10-CM | POA: Diagnosis not present

## 2018-06-03 DIAGNOSIS — Z0000 Encounter for general adult medical examination without abnormal findings: Secondary | ICD-10-CM

## 2018-06-03 DIAGNOSIS — I10 Essential (primary) hypertension: Secondary | ICD-10-CM | POA: Diagnosis not present

## 2018-06-03 DIAGNOSIS — R739 Hyperglycemia, unspecified: Secondary | ICD-10-CM | POA: Diagnosis not present

## 2018-06-03 NOTE — Addendum Note (Signed)
Addended by: Roney MansGAY, SHIRRON on: 06/03/2018 10:33 AM   Modules accepted: Orders

## 2018-06-03 NOTE — Assessment & Plan Note (Signed)

## 2018-06-03 NOTE — Assessment & Plan Note (Signed)
Recent a1c normal, cont diet, wt loss

## 2018-06-03 NOTE — Assessment & Plan Note (Signed)
stable overall by history and exam, recent data reviewed with pt, and pt to continue medical treatment as before,  to f/u any worsening symptoms or concerns  

## 2018-06-03 NOTE — Progress Notes (Signed)
Subjective:    Patient ID: Margaret Barron, female    DOB: [birth year], 52 y.o.   MRN: [id number]  HPI Here for wellness and f/u;  Overall doing ok;  Pt denies Chest pain, worsening SOB, DOE, wheezing, orthopnea, PND, worsening LE edema, palpitations, dizziness or syncope.  Pt denies neurological change such as new headache, facial or extremity weakness.  Pt denies polydipsia, polyuria, or low sugar symptoms. Pt states overall good compliance with treatment and medications, good tolerability, and has been trying to follow appropriate diet.  Pt denies worsening depressive symptoms, suicidal ideation or panic. No fever, night sweats, wt loss, loss of appetite, or other constitutional symptoms.  Pt states good ability with ADL's, has low fall risk, home safety reviewed and adequate, no other significant changes in hearing or vision, and only occasionally active with exercise. No new complaints Past Medical History:  Diagnosis Date  . Anemia    hx of menorrhagia  . History of echocardiogram    Echo 5/19: Severe LVH, EF 60-65, normal wall motion, grade 1 diastolic dysfunction, mild LAE  . Hypertension   . Morbid obesity (HCC)   . Trachea, stenosis    polyps removed age 6; required tracheostomy   Past Surgical History:  Procedure Laterality Date  . TRACHEOSTOMY      reports that she has never smoked. She has never used smokeless tobacco. She reports that she drinks about 0.6 oz of alcohol per week. She reports that she has current or past drug history. Drug: Marijuana. family history includes Atrial fibrillation in her mother; Hypertension in her father and mother. No Known Allergies Current Outpatient Medications on File Prior to Visit  Medication Sig Dispense Refill  . carvedilol (COREG) 12.5 MG tablet Take 1 tablet (12.5 mg total) by mouth 2 (two) times daily. 180 tablet 3   No current facility-administered medications on file prior to visit.    Review of Systems Constitutional: Negative  for other unusual diaphoresis, sweats, appetite or weight changes HENT: Negative for other worsening hearing loss, ear pain, facial swelling, mouth sores or neck stiffness.   Eyes: Negative for other worsening pain, redness or other visual disturbance.  Respiratory: Negative for other stridor or swelling Cardiovascular: Negative for other palpitations or other chest pain  Gastrointestinal: Negative for worsening diarrhea or loose stools, blood in stool, distention or other pain Genitourinary: Negative for hematuria, flank pain or other change in urine volume.  Musculoskeletal: Negative for myalgias or other joint swelling.  Skin: Negative for other color change, or other wound or worsening drainage.  Neurological: Negative for other syncope or numbness. Hematological: Negative for other adenopathy or swelling Psychiatric/Behavioral: Negative for hallucinations, other worsening agitation, SI, self-injury, or new decreased concentration All other system neg per pt    Objective:   Physical Exam BP (!) 146/88   Pulse (!) 107   Temp 97.8 F (36.6 C) (Oral)   Ht 5\' 11"  (1.803 m)   Wt (!) 364 lb (165.1 kg)   LMP 01/11/2016   SpO2 98%   BMI 50.77 kg/m  VS noted, not ill appaering, morbid obese Constitutional: Pt is oriented to person, place, and time. Appears well-developed and well-nourished, in no significant distress and comfortable Head: Normocephalic and atraumatic  Eyes: Conjunctivae and EOM are normal. Pupils are equal, round, and reactive to light Right Ear: External ear normal without discharge Left Ear: External ear normal without discharge Nose: Nose without discharge or deformity Mouth/Throat: Oropharynx is without other ulcerations and moist  Neck: Normal range of motion. Neck supple. No JVD present. No tracheal deviation present or significant neck LA or mass Cardiovascular: Normal rate, regular rhythm, normal heart sounds and intact distal pulses.   Pulmonary/Chest: WOB  normal and breath sounds without rales or wheezing  Abdominal: Soft. Bowel sounds are normal. NT. No HSM  Musculoskeletal: Normal range of motion. Exhibits no edema Lymphadenopathy: Has no other cervical adenopathy.  Neurological: Pt is alert and oriented to person, place, and time. Pt has normal reflexes. No cranial nerve deficit. Motor grossly intact, Gait intact Skin: Skin is warm and dry. No rash noted or new ulcerations Psychiatric:  Has normal mood and affect. Behavior is normal without agitation No other exam findings  Lab Results  Component Value Date   WBC 11.9 (H) 03/30/2018   HGB 12.3 03/30/2018   HCT 38.1 03/30/2018   PLT 227 03/30/2018   GLUCOSE 153 (H) 05/16/2018   CHOL 121 05/08/2018   TRIG 76 05/08/2018   HDL 51 05/08/2018   LDLCALC 55 05/08/2018   ALT 27 05/08/2018   AST 21 05/08/2018   NA 142 05/16/2018   K 4.4 05/16/2018   CL 104 05/16/2018   CREATININE 1.11 (H) 05/16/2018   BUN 16 05/16/2018   CO2 24 05/16/2018   TSH 1.220 05/08/2018   HGBA1C 5.4 05/16/2018       Assessment & Plan:

## 2018-06-03 NOTE — Patient Instructions (Addendum)
You will be contacted regarding the referral for: GYN for pap and mammogram, and colonoscopy  You had the Tdap tetanus shot today  Please continue all other medications as before, and refills have been done if requested.  Please have the pharmacy call with any other refills you may need.  Please continue your efforts at being more active, low cholesterol diet, and weight control.  You are otherwise up to date with prevention measures today.  Please keep your appointments with your specialists as you may have planned  Please return in 6 months, or sooner if needed, with Lab testing done 3-5 days before

## 2018-06-12 ENCOUNTER — Encounter: Payer: Self-pay | Admitting: Physician Assistant

## 2018-06-12 ENCOUNTER — Ambulatory Visit (INDEPENDENT_AMBULATORY_CARE_PROVIDER_SITE_OTHER): Payer: 59 | Admitting: Physician Assistant

## 2018-06-12 VITALS — BP 172/110 | HR 80 | Ht 71.0 in | Wt 369.0 lb

## 2018-06-12 DIAGNOSIS — N182 Chronic kidney disease, stage 2 (mild): Secondary | ICD-10-CM | POA: Diagnosis not present

## 2018-06-12 DIAGNOSIS — I119 Hypertensive heart disease without heart failure: Secondary | ICD-10-CM | POA: Diagnosis not present

## 2018-06-12 DIAGNOSIS — R0683 Snoring: Secondary | ICD-10-CM

## 2018-06-12 MED ORDER — LISINOPRIL 20 MG PO TABS: 20.0000 mg | ORAL_TABLET | Freq: Every day | ORAL | 3 refills | Status: AC

## 2018-06-12 NOTE — Patient Instructions (Addendum)
Medication Instructions:  1. START LISINOPRIL 20 MG TAKE 1 TABLET DAILY; NEW RX HAS BEEN SENT IN   Labwork: BMET TO BE DONE IN 1 WEEK ON 06/21/18; LAB IS OPEN FROM 7:30-4:30.  Testing/Procedures: Your physician has recommended that you have a SPLIGHT NIGHT sleep study. This test records several body functions during sleep, including: brain activity, eye movement, oxygen and carbon dioxide blood levels, heart rate and rhythm, breathing rate and rhythm, the flow of air through your mouth and nose, snoring, body muscle movements, and chest and belly movement. NINA JONES, CMA WILL CALL YOU AND SCHEDULE YOU FOR THE SLEEP STUDY    Follow-Up: Tereso NewcomerSCOTT WEAVER, [facility] ON 07/17/18 @ 11:45 AM   Any Other Special Instructions Will Be Listed Below (If Applicable).     If you need a refill on your cardiac medications before your next appointment, please call your pharmacy.

## 2018-06-12 NOTE — Progress Notes (Signed)
Cardiology Office Note:    Date:  06/12/2018   ID:  Margaret CaprioShane M Rainey, DOB Aug 31, 1966, MRN [id number]  PCP:  Corwin LevinsJohn, James W, MD  Cardiologist:  Will establish with MD on Care Team with Tereso NewcomerScott Weaver, PA-C    Referring MD: No ref. provider found   Chief Complaint  Patient presents with  . Follow-up    Hypertension    History of Present Illness:    Margaret Barron is a 52 y.o. female with hypertension.  She was initially evaluated in May 2019 after a visit to the emergency room with chest pain and markedly elevated blood pressure.  CT was negative for aneurysm or dissection.  She was placed on carvedilol.  Lab work demonstrated an elevated creatinine at 1.45.  Repeat was improved at 1.11.  Margaret Barron returns for further management of hypertension.  She is here alone.  Since last seen, she has been doing well.  She denies chest pain, shortness of breath, syncope, headaches.  She has been maintaining a low-salt diet.  Prior CV studies:   The following studies were reviewed today:  Echo 05/16/2018 Severe LVH, EF 60-65, normal wall motion, grade 1 diastolic dysfunction, mild LAE  Past Medical History:  Diagnosis Date  . Anemia    hx of menorrhagia  . History of CT scan of chest    Chest/Abd CTA 4/19: no thoracoabdominal aneurysm or dissection; no PE  . Hypertension   . LVH (left ventricular hypertrophy)    Echo 5/19: Severe LVH, EF 60-65, normal wall motion, grade 1 diastolic dysfunction, mild LAE  . Morbid obesity (HCC)   . Trachea, stenosis    polyps removed age 52; required tracheostomy   Surgical Hx: The patient  has a past surgical history that includes Tracheostomy.   Current Medications: Current Meds  Medication Sig  . carvedilol (COREG) 12.5 MG tablet Take 1 tablet (12.5 mg total) by mouth 2 (two) times daily.     Allergies:   Patient has no known allergies.   Social History   Tobacco Use  . Smoking status: Never Smoker  . Smokeless tobacco: Never Used    Substance Use Topics  . Alcohol use: Yes    Alcohol/week: 0.6 oz    Types: 1 Glasses of wine per week  . Drug use: Yes    Types: Marijuana     Family Hx: The patient's family history includes Atrial fibrillation in her mother; Hypertension in her father and mother. There is no history of Heart attack.  ROS:   Please see the history of present illness.    ROS All other systems reviewed and are negative.   EKGs/Labs/Other Test Reviewed:    EKG:  EKG is  ordered today.  The ekg ordered today demonstrates normal sinus rhythm, heart rate 80, LVH, T wave inversions 1, aVL, QTC 461, similar to prior tracings  Recent Labs: 03/30/2018: Hemoglobin 12.3; Platelets 227 05/08/2018: ALT 27; TSH 1.220 05/16/2018: BUN 16; Creatinine, Ser 1.11; Potassium 4.4; Sodium 142   Recent Lipid Panel Lab Results  Component Value Date/Time   CHOL 121 05/08/2018 11:24 AM   TRIG 76 05/08/2018 11:24 AM   HDL 51 05/08/2018 11:24 AM   CHOLHDL 2.4 05/08/2018 11:24 AM   CHOLHDL 3 11/30/2010 08:45 AM   LDLCALC 55 05/08/2018 11:24 AM    Physical Exam:    VS:  BP (!) 172/110   Pulse 80   Ht 5\' 11"  (1.803 m)   Wt (!) 369 lb (167.4 kg)  LMP 01/11/2016   SpO2 97%   BMI 51.47 kg/m     Wt Readings from Last 3 Encounters:  06/12/18 (!) 369 lb (167.4 kg)  06/03/18 (!) 364 lb (165.1 kg)  05/08/18 (!) 356 lb (161.5 kg)     Physical Exam  Constitutional: She is oriented to person, place, and time. She appears well-developed and well-nourished. No distress.  HENT:  Head: Normocephalic and atraumatic.  Neck: Neck supple. No JVD present.  Cardiovascular: Normal rate, regular rhythm, S1 normal and S2 normal.  Murmur heard.  Systolic murmur is present with a grade of 1/6 at the upper right sternal border. Pulmonary/Chest: Effort normal. She has no rales.  Abdominal: Soft. There is no hepatomegaly.  Musculoskeletal: She exhibits no edema.  Neurological: She is alert and oriented to person, place, and time.   Skin: Skin is warm and dry.    ASSESSMENT & PLAN:    Hypertensive heart disease without heart failure Blood pressure remains uncontrolled.  She is currently taking carvedilol twice a day.  She has been maintaining a low-salt diet.  Recent lab work did demonstrate evidence of mild renal insufficiency.  -Lisinopril 20 mg daily  -Continue current dose of carvedilol  -BMET 1 week  -Follow-up with me in 4 weeks  -Consider increasing carvedilol versus adding another agent at next visit  Snoring  She does note a history of snoring.  With her uncontrolled blood pressure, we should rule out sleep apnea.  Arrange sleep study.  Morbid obesity (HCC) We discussed the importance of diet, exercise and weight loss.  CKD As noted, recent lab work demonstrated creatinine 1.11-1.45.  Add ACE inhibitor as noted.  Obtain follow-up BMET 1 week.   Dispo:  Return in about 1 month (around 07/10/2018) for Routine Follow Up, w/ Tereso Newcomer, PA-C.   Medication Adjustments/Labs and Tests Ordered: Current medicines are reviewed at length with the patient today.  Concerns regarding medicines are outlined above.  Tests Ordered: Orders Placed This Encounter  Procedures  . Basic Metabolic Panel (BMET)  . EKG 12-Lead  . Split night study   Medication Changes: Meds ordered this encounter  Medications  . lisinopril (PRINIVIL,ZESTRIL) 20 MG tablet    Sig: Take 1 tablet (20 mg total) by mouth daily.    Dispense:  90 tablet    Refill:  3    Signed, Tereso Newcomer, PA-C  06/12/2018 5:36 PM    [facility] [address] [place], [place], [place]  [address] Phone: [phone]; Fax: [phone]

## 2018-06-13 ENCOUNTER — Telehealth: Payer: Self-pay | Admitting: *Deleted

## 2018-06-13 NOTE — Telephone Encounter (Signed)
-----   Message from Tarri Fullerarol M Fiato, CMA sent at 06/12/2018 11:26 AM EDT ----- Regarding: SPLIT NIGHT SLEEP  Hi ladies,  Pt saw Tereso NewcomerScott Weaver, [place] today and has been ordered to have a Split Night Sleep Study. Pt has filled out the Epworth Sleepiness Scale, paper given to Veda CanningNina Jones, CMA on 06/12/18.  Thank you Okey Regalarol

## 2018-06-13 NOTE — Telephone Encounter (Signed)
PA submitted to [employer] for in lab sleep study. 

## 2018-06-21 ENCOUNTER — Other Ambulatory Visit: Payer: 59 | Admitting: *Deleted

## 2018-06-21 DIAGNOSIS — I119 Hypertensive heart disease without heart failure: Secondary | ICD-10-CM

## 2018-06-21 LAB — BASIC METABOLIC PANEL
BUN / CREAT RATIO: 12 (ref 9–23)
BUN: 13 mg/dL (ref 6–24)
CHLORIDE: 106 mmol/L (ref 96–106)
CO2: 24 mmol/L (ref 20–29)
Calcium: 9.5 mg/dL (ref 8.7–10.2)
Creatinine, Ser: 1.09 mg/dL — ABNORMAL HIGH (ref 0.57–1.00)
GFR calc non Af Amer: 59 mL/min/{1.73_m2} — ABNORMAL LOW (ref >59)
GFR, EST AFRICAN AMERICAN: 67 mL/min/{1.73_m2} (ref >59)
GLUCOSE: 107 mg/dL — AB (ref 65–99)
Potassium: 3.8 mmol/L (ref 3.5–5.2)
Sodium: 143 mmol/L (ref 134–144)

## 2018-06-27 ENCOUNTER — Telehealth: Payer: Self-pay | Admitting: *Deleted

## 2018-06-27 NOTE — Telephone Encounter (Signed)
-----   Message from Tarri Fullerarol M Fiato, CMA sent at 06/12/2018 11:26 AM EDT ----- Regarding: SPLIT NIGHT SLEEP  Hi ladies,  Pt saw Tereso NewcomerScott Weaver, [place] today and has been ordered to have a Split Night Sleep Study. Pt has filled out the Epworth Sleepiness Scale, paper given to Veda CanningNina Jones, CMA on 06/12/18.  Thank you Okey Regalarol

## 2018-06-27 NOTE — Telephone Encounter (Signed)
[facility] Ok to schedule. Danielle RankinCarol Fiato was cc'd on the message.

## 2018-07-03 ENCOUNTER — Ambulatory Visit: Payer: Self-pay | Admitting: Gynecology

## 2018-07-04 ENCOUNTER — Telehealth: Payer: Self-pay | Admitting: *Deleted

## 2018-07-04 NOTE — Telephone Encounter (Signed)
-----   Message from Gaynelle CageWanda M Waddell, CMA sent at 06/27/2018 11:41 AM EDT ----- Regarding: RE: SPLIT NIGHT SLEEP  [facility] Ok to schedule. auth # [id number].  VALID  06/13/18 to 12/17/18. Assign referral. ----- Message ----- From: Tarri FullerFiato, Carol M, CMA Sent: 06/12/2018  11:26 AM To: [facility] Sleep [facility] Subject: SPLIT NIGHT SLEEP                              Hi ladies,  Pt saw Tereso NewcomerScott Weaver, [place] today and has been ordered to have a Split Night Sleep Study. Pt has filled out the Epworth Sleepiness Scale, paper given to Veda CanningNina Matika Bartell, CMA on 06/12/18.  Thank you Okey Regalarol

## 2018-07-04 NOTE — Telephone Encounter (Signed)
Patient is scheduled for lab study on 07/31/18. [facility] Patient understands he will receive a sleep packet in a week or so. Patient understands to call if he does not receive the sleep packet in a timely manner. Voicemail full can not leave message.

## 2018-07-05 NOTE — Telephone Encounter (Signed)
Per DPR left a message with patients sister Deanna Artiskeisha to have the patient to call and confirm or reschedule her appointment.

## 2018-07-17 ENCOUNTER — Ambulatory Visit: Payer: 59 | Admitting: Physician Assistant

## 2018-07-31 ENCOUNTER — Ambulatory Visit (HOSPITAL_BASED_OUTPATIENT_CLINIC_OR_DEPARTMENT_OTHER): Payer: 59 | Attending: Physician Assistant | Admitting: Cardiology

## 2018-07-31 VITALS — Ht 71.0 in | Wt 340.0 lb

## 2018-07-31 DIAGNOSIS — R0683 Snoring: Secondary | ICD-10-CM | POA: Diagnosis not present

## 2018-07-31 DIAGNOSIS — I119 Hypertensive heart disease without heart failure: Secondary | ICD-10-CM

## 2018-07-31 DIAGNOSIS — E669 Obesity, unspecified: Secondary | ICD-10-CM | POA: Insufficient documentation

## 2018-07-31 DIAGNOSIS — G4733 Obstructive sleep apnea (adult) (pediatric): Secondary | ICD-10-CM

## 2018-07-31 DIAGNOSIS — Z6841 Body Mass Index (BMI) 40.0 and over, adult: Secondary | ICD-10-CM | POA: Insufficient documentation

## 2018-08-13 NOTE — Procedures (Addendum)
   Patient Name: Margaret Barron, [name] Study Date: 07/31/2018 Gender: Female D.O.B: 1966/12/17 Age (years): 52 Referring Provider: Tereso NewcomerScott Weaver Height (inches): 71 Interpreting Physician: Armanda Magicraci Turner MD, ABSM Weight (lbs): 340 RPSGT: Armen PickupFord, Evelyn BMI: 47 MRN: [id number] Neck Size: 15.00  CLINICAL INFORMATION Sleep Study Type: NPSG  Indication for sleep study: Obesity, OSA, Snoring  SLEEP STUDY TECHNIQUE As per the AASM Manual for the Scoring of Sleep and Associated Events v2.3 (April 2016) with a hypopnea requiring 4% desaturations.  The channels recorded and monitored were frontal, central and occipital EEG, electrooculogram (EOG), submentalis EMG (chin), nasal and oral airflow, thoracic and abdominal wall motion, anterior tibialis EMG, snore microphone, electrocardiogram, and pulse oximetry.  MEDICATIONS Medications self-administered by patient taken the night of the study : N/A  SLEEP ARCHITECTURE The study was initiated at 10:29:14 PM and ended at 4:40:46 AM.  Sleep onset time was 24.3 minutes and the sleep efficiency was 63.9%%. The total sleep time was 237.5 minutes.  Stage REM latency was 164.5 minutes.  The patient spent 12.4%% of the night in stage N1 sleep, 61.1%% in stage N2 sleep, 9.3%% in stage N3 and 17.3% in REM.  Alpha intrusion was absent.  Supine sleep was 27.37%.  RESPIRATORY PARAMETERS The overall apnea/hypopnea index (AHI) was 6.6 per hour. There were 12 total apneas, including 12 obstructive, 0 central and 0 mixed apneas. There were 14 hypopneas and 52 RERAs.  The AHI during Stage REM sleep was 20.5 per hour.  AHI while supine was 7.4 per hour.  The mean oxygen saturation was 94.7%. The minimum SpO2 during sleep was 88.0%.  moderate snoring was noted during this study.  CARDIAC DATA The 2 lead EKG demonstrated sinus rhythm. The mean heart rate was 71.0 beats per minute. Other EKG findings include: None.  LEG MOVEMENT DATA The total PLMS were 0  with a resulting PLMS index of 0.0. Associated arousal with leg movement index was 0.0 .  IMPRESSIONS - Mild obstructive sleep apnea occurred during this study (AHI = 6.6/h). - No significant central sleep apnea occurred during this study (CAI = 0.0/h). - The patient had minimal or no oxygen desaturation during the study (Min O2 = 88.0%) - The patient snored with moderate snoring volume. - No cardiac abnormalities were noted during this study. - Clinically significant periodic limb movements did not occur during sleep. No significant associated arousals.  DIAGNOSIS - Obstructive Sleep Apnea (327.23 [G47.33 ICD-10])  RECOMMENDATIONS - Very mild obstructive sleep apnea overall but moderate during REM sleep.  - Recommend CPAP titration in lab. - Avoid alcohol, sedatives and other CNS depressants that may worsen sleep apnea and disrupt normal sleep architecture. - Sleep hygiene should be reviewed to assess factors that may improve sleep quality. - Weight management and regular exercise should be initiated or continued if appropriate.  [Electronically signed] 08/13/2018 09:15 PM  Armanda Magicraci Turner MD, ABSM Diplomate, American Board of Sleep Medicine

## 2018-08-14 ENCOUNTER — Telehealth: Payer: Self-pay | Admitting: *Deleted

## 2018-08-14 NOTE — Telephone Encounter (Signed)
-----   Message from Beatrice LecherScott T Weaver, [place]PA-C sent at 08/14/2018  5:37 PM EDT ----- Patient needs follow up on hypertension.  Please schedule appointment. Tereso NewcomerScott Weaver, PA-C    08/14/2018 5:37 PM

## 2018-08-14 NOTE — Telephone Encounter (Signed)
PER SCOTT W. PAC.Marland Kitchen.NEED APPT TO F/U ON BP

## 2018-08-20 ENCOUNTER — Telehealth: Payer: Self-pay | Admitting: *Deleted

## 2018-08-20 DIAGNOSIS — R0683 Snoring: Secondary | ICD-10-CM

## 2018-08-20 NOTE — Telephone Encounter (Signed)
-----   Message from Quintella Reichert, MD sent at 08/13/2018  9:18 PM EDT ----- Please let patient know that they have sleep apnea and recommend CPAP titration. Please set up titration in the sleep lab.

## 2018-08-20 NOTE — Telephone Encounter (Signed)
Called results lmtcb on cell phone.

## 2018-08-30 ENCOUNTER — Telehealth: Payer: Self-pay | Admitting: *Deleted

## 2018-08-30 NOTE — Telephone Encounter (Signed)
PA submitted to [employer] for CPAP titration. 

## 2018-09-05 NOTE — Telephone Encounter (Signed)
Called results on cell lmtcb.

## 2018-09-11 ENCOUNTER — Encounter: Payer: Self-pay | Admitting: Internal Medicine

## 2018-09-12 ENCOUNTER — Telehealth: Payer: Self-pay | Admitting: *Deleted

## 2018-09-12 DIAGNOSIS — G4733 Obstructive sleep apnea (adult) (pediatric): Secondary | ICD-10-CM

## 2018-09-12 NOTE — Telephone Encounter (Signed)
Staff message sent to Nina CPAP titration denied by [facility].  Please notify ordering provider. Can order APAP or call [phone] for peer to peer.

## 2018-09-12 NOTE — Telephone Encounter (Signed)
-----   Message from Reesa Chew, CMA sent at 08/30/2018  8:22 AM EDT ----- Regarding: pre cert cpap titration

## 2018-09-16 ENCOUNTER — Encounter: Payer: Self-pay | Admitting: *Deleted

## 2018-09-16 NOTE — Telephone Encounter (Signed)
Called results LMTCB on cell.

## 2018-09-16 NOTE — Telephone Encounter (Signed)
No contact letter sent out today. 

## 2018-12-04 ENCOUNTER — Ambulatory Visit: Payer: 59 | Admitting: Internal Medicine

## 2018-12-19 ENCOUNTER — Encounter: Payer: Self-pay | Admitting: Internal Medicine

## 2018-12-19 ENCOUNTER — Ambulatory Visit (INDEPENDENT_AMBULATORY_CARE_PROVIDER_SITE_OTHER): Payer: 59 | Admitting: Internal Medicine

## 2018-12-19 VITALS — BP 142/94 | HR 90 | Temp 98.3°F | Ht 71.0 in | Wt 350.0 lb

## 2018-12-19 DIAGNOSIS — I10 Essential (primary) hypertension: Secondary | ICD-10-CM | POA: Diagnosis not present

## 2018-12-19 DIAGNOSIS — E876 Hypokalemia: Secondary | ICD-10-CM

## 2018-12-19 DIAGNOSIS — R739 Hyperglycemia, unspecified: Secondary | ICD-10-CM | POA: Diagnosis not present

## 2018-12-19 HISTORY — DX: Essential (primary) hypertension: I10

## 2018-12-19 LAB — POCT GLYCOSYLATED HEMOGLOBIN (HGB A1C): Hemoglobin A1C: 5.2 % (ref 4.0–5.6)

## 2018-12-19 NOTE — Assessment & Plan Note (Signed)
Also for f/u lab with cardiology per pt preference

## 2018-12-19 NOTE — Patient Instructions (Addendum)
Your A1c was Ok today  Please continue all other medications as before, and refills have been done if requested.  Please have the pharmacy call with any other refills you may need.  Please continue your efforts at being more active, low cholesterol diet, and weight control.  Please keep your appointments with your specialists as you may have planned - cardiology  Please return in 6 months, or sooner if needed, with Lab testing done 3-5 days before

## 2018-12-19 NOTE — Assessment & Plan Note (Signed)
stable overall by history and exam, recent data reviewed with pt, and pt to continue medical treatment as before,  to f/u any worsening symptoms or concerns, for a1c today 

## 2018-12-19 NOTE — Addendum Note (Signed)
Addended by: Roney Mans on: 12/19/2018 11:21 AM   Modules accepted: Orders

## 2018-12-19 NOTE — Assessment & Plan Note (Signed)
Mild uncontrolled, pt prefers f/u with cardiology for further tx

## 2018-12-19 NOTE — Progress Notes (Addendum)
Subjective:    Patient ID: Margaret Barron, female    DOB: [birth year], 53 y.o.   MRN: [id number]  HPI  Here to f/u; overall doing ok,  Pt denies chest pain, increasing sob or doe, wheezing, orthopnea, PND, increased LE swelling, palpitations, dizziness or syncope.  Pt denies new neurological symptoms such as new headache, or facial or extremity weakness or numbness.  Pt denies polydipsia, polyuria, or low sugar episode.  Pt states overall good compliance with meds, mostly trying to follow appropriate diet, with wt overall stable,  but little exercise however. Bp imiproved with tx per cardiology, does not want change today, plans to f/u cardiology soon.   BP Readings from Last 3 Encounters:  12/19/18 (!) 142/94  06/12/18 (!) 172/110  06/03/18 (!) 146/88   Wt Readings from Last 3 Encounters:  12/19/18 (!) 350 lb (158.8 kg)  07/31/18 (!) 340 lb (154.2 kg)  06/12/18 (!) 369 lb (167.4 kg)   Past Medical History:  Diagnosis Date  . Anemia    hx of menorrhagia  . History of CT scan of chest    Chest/Abd CTA 4/19: no thoracoabdominal aneurysm or dissection; no PE  . HTN (hypertension) 12/19/2018  . Hypertension   . LVH (left ventricular hypertrophy)    Echo 5/19: Severe LVH, EF 60-65, normal wall motion, grade 1 diastolic dysfunction, mild LAE  . Morbid obesity (HCC)   . Trachea, stenosis    polyps removed age 5; required tracheostomy   Past Surgical History:  Procedure Laterality Date  . TRACHEOSTOMY      reports that she has never smoked. She has never used smokeless tobacco. She reports current alcohol use of about 1.0 standard drinks of alcohol per week. She reports current drug use. Drug: Marijuana. family history includes Atrial fibrillation in her mother; Hypertension in her father and mother. No Known Allergies Current Outpatient Medications on File Prior to Visit  Medication Sig Dispense Refill  . carvedilol (COREG) 12.5 MG tablet Take 1 tablet (12.5 mg total) by mouth 2 (two)  times daily. 180 tablet 3  . lisinopril (PRINIVIL,ZESTRIL) 20 MG tablet Take 1 tablet (20 mg total) by mouth daily. 90 tablet 3   No current facility-administered medications on file prior to visit.    Review of Systems  Constitutional: Negative for other unusual diaphoresis or sweats HENT: Negative for ear discharge or swelling Eyes: Negative for other worsening visual disturbances Respiratory: Negative for stridor or other swelling  Gastrointestinal: Negative for worsening distension or other blood Genitourinary: Negative for retention or other urinary change Musculoskeletal: Negative for other MSK pain or swelling Skin: Negative for color change or other new lesions Neurological: Negative for worsening tremors and other numbness  Psychiatric/Behavioral: Negative for worsening agitation or other fatigue All other system neg per pt    Objective:   Physical Exam BP (!) 142/94   Pulse 90   Temp 98.3 F (36.8 C) (Oral)   Ht 5\' 11"  (1.803 m)   Wt (!) 350 lb (158.8 kg)   LMP 01/11/2016   SpO2 97%   BMI 48.82 kg/m  VS noted,  Constitutional: Pt appears in NAD HENT: Head: NCAT.  Right Ear: External ear normal.  Left Ear: External ear normal.  Eyes: . Pupils are equal, round, and reactive to light. Conjunctivae and EOM are normal Nose: without d/c or deformity Neck: Neck supple. Gross normal ROM Cardiovascular: Normal rate and regular rhythm.   Pulmonary/Chest: Effort normal and breath sounds without rales or wheezing.  Abd:  Soft, NT, ND, + BS, no organomegaly Neurological: Pt is alert. At baseline orientation, motor grossly intact Skin: Skin is warm. No rashes, other new lesions, no LE edema Psychiatric: Pt behavior is normal without agitation  No other exam findings Lab Results  Component Value Date   WBC 11.9 (H) 03/30/2018   HGB 12.3 03/30/2018   HCT 38.1 03/30/2018   PLT 227 03/30/2018   GLUCOSE 107 (H) 06/21/2018   CHOL 121 05/08/2018   TRIG 76 05/08/2018   HDL  51 05/08/2018   LDLCALC 55 05/08/2018   ALT 27 05/08/2018   AST 21 05/08/2018   NA 143 06/21/2018   K 3.8 06/21/2018   CL 106 06/21/2018   CREATININE 1.09 (H) 06/21/2018   BUN 13 06/21/2018   CO2 24 06/21/2018   TSH 1.220 05/08/2018   HGBA1C 5.4 05/16/2018    POCT glycosylated hemoglobin (Hb A1C)  Order: [id number]  Status:  Final result Visible to patient:  No (Not Released) Dx:  Hyperglycemia   Ref Range & Units 11:15 60mo ago  Hemoglobin A1C 4.0 - 5.6 % 5.2  5.4 R, CM           Assessment & Plan:

## 2018-12-25 ENCOUNTER — Ambulatory Visit (INDEPENDENT_AMBULATORY_CARE_PROVIDER_SITE_OTHER): Payer: 59 | Admitting: Physician Assistant

## 2018-12-25 ENCOUNTER — Encounter: Payer: Self-pay | Admitting: Physician Assistant

## 2018-12-25 VITALS — BP 164/88 | HR 78 | Ht 71.0 in | Wt 354.0 lb

## 2018-12-25 DIAGNOSIS — I119 Hypertensive heart disease without heart failure: Secondary | ICD-10-CM | POA: Diagnosis not present

## 2018-12-25 DIAGNOSIS — G4733 Obstructive sleep apnea (adult) (pediatric): Secondary | ICD-10-CM | POA: Diagnosis not present

## 2018-12-25 MED ORDER — CARVEDILOL 25 MG PO TABS: 25.0000 mg | ORAL_TABLET | Freq: Two times a day (BID) | ORAL | 3 refills | Status: AC

## 2018-12-25 NOTE — Patient Instructions (Signed)
Medication Instructions:  Your physician has recommended you make the following change in your medication:  1. INCREASE COREG TO 25 MG TWICE DAILY.  If you need a refill on your cardiac medications before your next appointment, please call your pharmacy.   Lab work: NONE If you have labs (blood work) drawn today and your tests are completely normal, you will receive your results only by: Marland Kitchen MyChart Message (if you have MyChart) OR . A paper copy in the mail If you have any lab test that is abnormal or we need to change your treatment, we will call you to review the results.  Testing/Procedures: NONE  Follow-Up: At [facility], you and your health needs are our priority.  As part of our continuing mission to provide you with exceptional heart care, we have created designated Provider Care Teams.  These Care Teams include your primary Cardiologist (physician) and Advanced Practice Providers (APPs -  Physician Assistants and Nurse Practitioners) who all work together to provide you with the care you need, when you need it. You will need a follow up appointment in:  3 months  WITH  DR. Elease Hashimoto. Your physician recommends that you schedule a follow-up appointment WITH Tereso Newcomer, PA-C IN 4 WEEKS   Any Other Special Instructions Will Be Listed Below (If Applicable).

## 2018-12-25 NOTE — Progress Notes (Signed)
Cardiology Office Note:    Date:  12/25/2018   ID:  Margaret Barron, DOB [birth year], MRN [id number]  PCP:  Margaret Levins, MD  Cardiologist:  Margaret Miss, MD / Margaret Newcomer, PA-C  Electrophysiologist:  None   Referring MD: Margaret Levins, MD   Chief Complaint  Patient presents with  . Follow-up    BP    History of Present Illness:    Margaret Barron is a 53 y.o. female with hypertension.  She was initially evaluated in May 2019 after a visit to the emergency room with chest pain and markedly elevated blood pressure.  CT was negative for aneurysm or dissection.  She was placed on carvedilol.  Lab work demonstrated an elevated creatinine at 1.45.  Repeat was improved at 1.11.  She was last seen in 05/2018.     Sleep study in 07/2018 did demonstrate mild OSA.  CPAP titration was recommended but insurance denied this.  APAP titration was recommended.     Ms. Margaret Barron returns for follow up.  She is here alone.  She is doing well.  She went to the [place] for vacation and did notice she was short of breath with going up steps.  She otherwise has not had any chest pain, shortness of breath, syncope, paroxysmal nocturnal dyspnea.  She has some lower extremity swelling with prolonged standing.  She has not had any headaches.  Since last seen, she has lost about 15 lbs.  She continues to limit salt in her diet.   Prior CV studies:   The following studies were reviewed today:  Echo 05/16/2018 Severe LVH, EF 60-65, normal wall motion, grade 1 diastolic dysfunction, mild LAE  Past Medical History:  Diagnosis Date  . Anemia    hx of menorrhagia  . History of CT scan of chest    Chest/Abd CTA 4/19: no thoracoabdominal aneurysm or dissection; no PE  . HTN (hypertension) 12/19/2018  . Hypertension   . LVH (left ventricular hypertrophy)    Echo 5/19: Severe LVH, EF 60-65, normal wall motion, grade 1 diastolic dysfunction, mild LAE  . Morbid obesity (HCC)   . Trachea, stenosis    polyps  removed age 74; required tracheostomy   Surgical Hx: The patient  has a past surgical history that includes Tracheostomy.   Current Medications: Current Meds  Medication Sig  . lisinopril (PRINIVIL,ZESTRIL) 20 MG tablet Take 1 tablet (20 mg total) by mouth daily.  . [DISCONTINUED] carvedilol (COREG) 12.5 MG tablet Take 1 tablet (12.5 mg total) by mouth 2 (two) times daily.     Allergies:   Patient has no known allergies.   Social History   Tobacco Use  . Smoking status: Never Smoker  . Smokeless tobacco: Never Used  Substance Use Topics  . Alcohol use: Yes    Alcohol/week: 1.0 standard drinks    Types: 1 Glasses of wine per week  . Drug use: Yes    Types: Marijuana     Family Hx: The patient's family history includes Atrial fibrillation in her mother; Hypertension in her father and mother. There is no history of Heart attack.  ROS:   Please see the history of present illness.    ROS All other systems reviewed and are negative.   EKGs/Labs/Other Test Reviewed:    EKG:  EKG is ordered today.  The ekg ordered today demonstrates normal sinus rhythm, heart rate 78, normal axis, T wave inversions 1, aVL, LVH, QTC 474, similar to prior tracing  Recent Labs: 03/30/2018: Hemoglobin 12.3; Platelets 227 05/08/2018: ALT 27; TSH 1.220 06/21/2018: BUN 13; Creatinine, Ser 1.09; Potassium 3.8; Sodium 143   Recent Lipid Panel Lab Results  Component Value Date/Time   CHOL 121 05/08/2018 11:24 AM   TRIG 76 05/08/2018 11:24 AM   HDL 51 05/08/2018 11:24 AM   CHOLHDL 2.4 05/08/2018 11:24 AM   CHOLHDL 3 11/30/2010 08:45 AM   LDLCALC 55 05/08/2018 11:24 AM    Physical Exam:    VS:  BP (!) 164/88   Pulse 78   Ht 5\' 11"  (1.803 m)   Wt (!) 354 lb (160.6 kg)   LMP 01/11/2016   SpO2 97%   BMI 49.37 kg/m     Wt Readings from Last 3 Encounters:  12/25/18 (!) 354 lb (160.6 kg)  12/19/18 (!) 350 lb (158.8 kg)  07/31/18 (!) 340 lb (154.2 kg)     Physical Exam  Constitutional: She  is oriented to person, place, and time. She appears well-developed and well-nourished. No distress.  HENT:  Head: Normocephalic and atraumatic.  Eyes: No scleral icterus.  Neck: No JVD present. No thyromegaly present.  Cardiovascular: Normal rate and regular rhythm.  Murmur heard.  Systolic murmur is present with a grade of 2/6 at the upper right sternal border, upper left sternal border and lower left sternal border. Pulmonary/Chest: Effort normal. She has no rales.  Abdominal: Soft.  Musculoskeletal:        General: No edema.  Lymphadenopathy:    She has no cervical adenopathy.  Neurological: She is alert and oriented to person, place, and time.  Skin: Skin is warm and dry.  Psychiatric: She has a normal mood and affect.    ASSESSMENT & PLAN:    Hypertensive heart disease without heart failure  The patient's blood pressure remains uncontrolled.  She does have a systolic murmur on exam.  I reviewed her echocardiogram today with Dr. Elease Hashimoto.  Her peak LVOT systolic gradient is a little less than 1 m/s.  Therefore, she does not appear to have significant obstruction.  She does not have a history of syncope.  Consider follow-up echo in May 2020.  Continue current dose of Losartan.  Increase Carvedilol to 25 mg twice daily.  At follow-up, consider adding spironolactone versus hydrochlorothiazide.  She may also need the addition of amlodipine.  I will follow her along with Dr. Elease Hashimoto going forward.  She will see me in 4 weeks and Dr. Elease Hashimoto in 3 months.  OSA (obstructive sleep apnea )  I will have our office contact her to set up CPAP titration at home.  OBESITY, MORBID She has lost about 15 pounds since she was last seen in this clinic.  I congratulated her on this and encouraged her to continue weight loss for improved blood pressure control.   Dispo:  Return in about 4 weeks (around 01/22/2019) for Routine Follow Up, w/ Margaret Newcomer, PA-C.   Medication Adjustments/Labs and Tests  Ordered: Current medicines are reviewed at length with the patient today.  Concerns regarding medicines are outlined above.  Tests Ordered: Orders Placed This Encounter  Procedures  . EKG 12-Lead   Medication Changes: Meds ordered this encounter  Medications  . carvedilol (COREG) 25 MG tablet    Sig: Take 1 tablet (25 mg total) by mouth 2 (two) times daily.    Dispense:  180 tablet    Refill:  3    Signed, Margaret Newcomer, PA-C  12/25/2018 10:24 AM    [facility] [address], [place], [place]  [address] Phone: [phone]; Fax: [phone]

## 2018-12-27 NOTE — Telephone Encounter (Signed)
Please order Airsense CPAP with heated humidity on auto from 4 to 18cm H2O and mask of choice.  Get a download in 2 weeks and followup with me in 10 weeks.

## 2018-12-30 NOTE — Telephone Encounter (Addendum)
Informed patient of sleep study results and patient understanding was verbalized. Patient understands her sleep study showed they have sleep apnea and recommend CPAP titration. Please set up titration in the sleep lab. Pt is aware and agreeable to these results.  cpap machine ordered

## 2018-12-30 NOTE — Telephone Encounter (Addendum)
APAP ordered.

## 2018-12-30 NOTE — Addendum Note (Signed)
Addended by: Reesa Chew on: 12/30/2018 03:39 PM   Modules accepted: Orders

## 2018-12-30 NOTE — Telephone Encounter (Signed)
Upon patient request DME selection is [facility]. Patient understands he will be contacted by [facility] to set up his cpap. Patient understands to call if [facility] does not contact him with new setup in a timely manner. Patient understands they will be called once confirmation has been received from [facility] that they have received their new machine to schedule 10 week follow up appointment.  [facility] notified of new cpap order  Please add to airview Patient was grateful for the call and thanked me. 

## 2019-01-21 ENCOUNTER — Ambulatory Visit: Payer: 59 | Admitting: Physician Assistant

## 2019-02-11 ENCOUNTER — Ambulatory Visit: Payer: 59 | Admitting: Physician Assistant

## 2019-03-18 ENCOUNTER — Telehealth: Payer: Self-pay

## 2019-03-18 NOTE — Telephone Encounter (Signed)
Left message for pt to call back about appt. 

## 2019-03-25 NOTE — Telephone Encounter (Signed)
Left message for patient to call back about appointment with Dr. Elease Hashimoto on 4/14. I sent virtual visit information via patient's MyChart

## 2019-04-01 ENCOUNTER — Ambulatory Visit: Payer: 59 | Admitting: Cardiovascular Disease

## 2019-04-14 IMAGING — DX DG CHEST 2V
2 series · 2 of 2 positions shown · non-contrast
Comparison: 04/16/2012

CLINICAL DATA: Low back pain radiating up LEFT side into shoulder
blade region, EKG changes, history hypertension

EXAM:
CHEST - 2 VIEW

[chest pa]
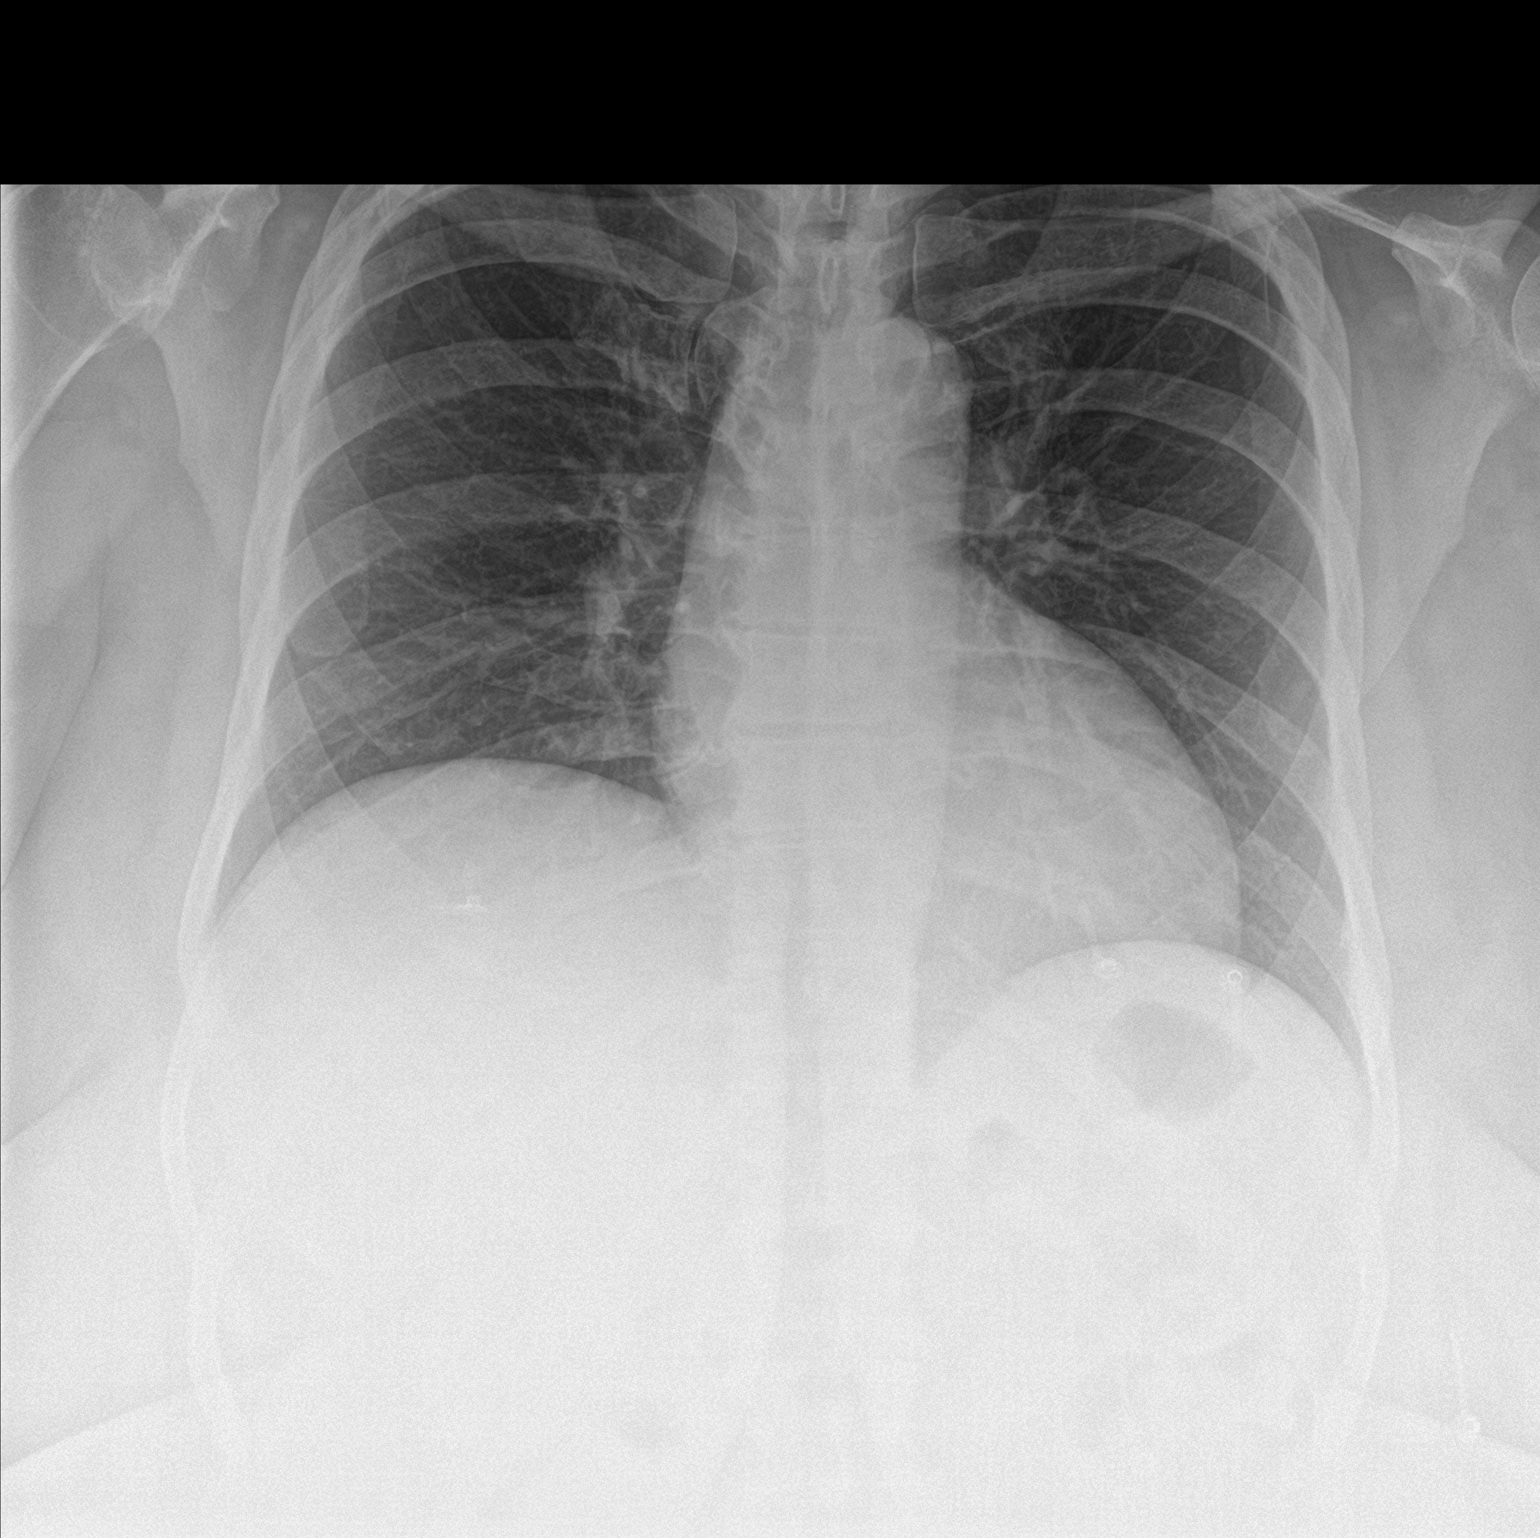

[chest lat]
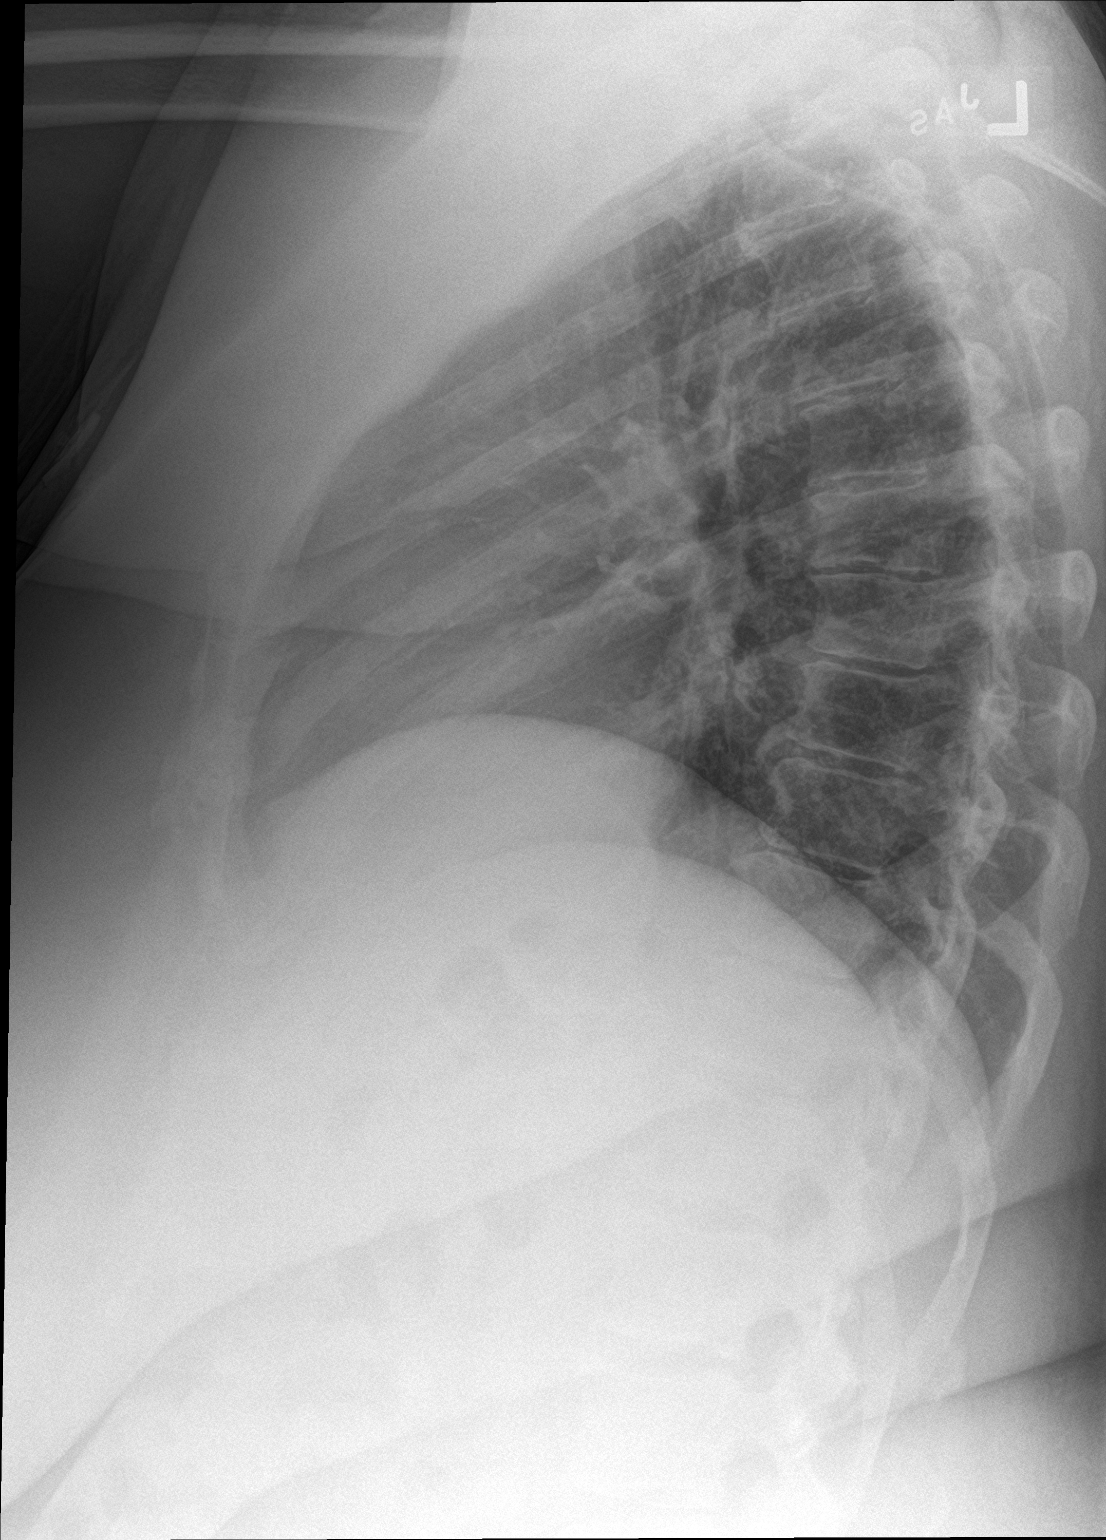

[2 of 2 positions shown; findings below may reference images not displayed]

FINDINGS: Borderline enlargement of cardiac silhouette.

Mediastinal contours and pulmonary vascularity normal.

Lungs clear.

No pleural effusion or pneumothorax.

Bones unremarkable.
IMPRESSION: Borderline enlargement of cardiac silhouette without acute
infiltrate.

## 2019-04-14 IMAGING — CT CT ANGIO CHEST-ABD-PELV FOR DISSECTION W/ AND WO/W CM
2 of 7 series · 15 of 46 positions shown, 17 images · IV contrast (APPLIED)
Comparison: CT abdomen/pelvis dated 11/19/2009

CLINICAL DATA: Chest/back pain x3 days

EXAM:
CT ANGIOGRAPHY CHEST, ABDOMEN AND PELVIS
TECHNIQUE: Multidetector CT imaging through the chest, abdomen and pelvis was
performed using the standard protocol during bolus administration of
intravenous contrast. Multiplanar reconstructed images and MIPs were
obtained and reviewed to evaluate the vascular anatomy.
CONTRAST:  100mL XR042R-T1U IOPAMIDOL (XR042R-T1U) INJECTION 76%

[Series 7: arterial · axial · arterial · 0.98mm/px · z∈[+916,+1528]mm · 12 of 344 slices shown, 14 images]
[im 19/344  soft-tissue]
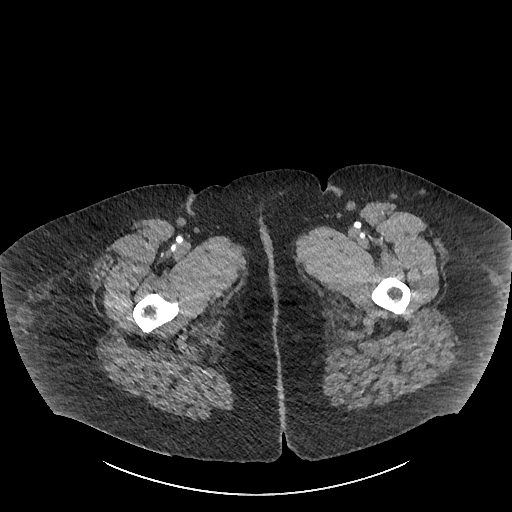
[im 19/344  bone]
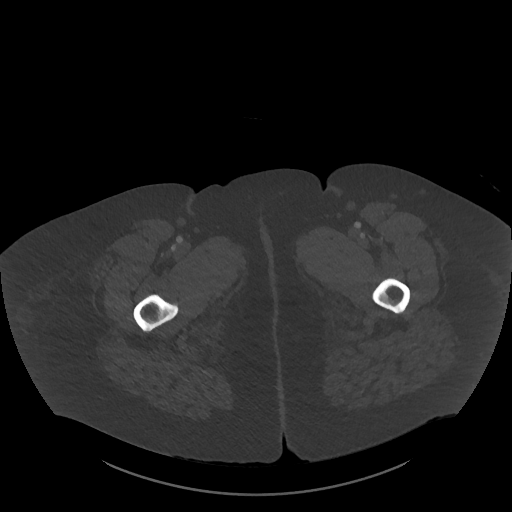
[im 55/344  soft-tissue]
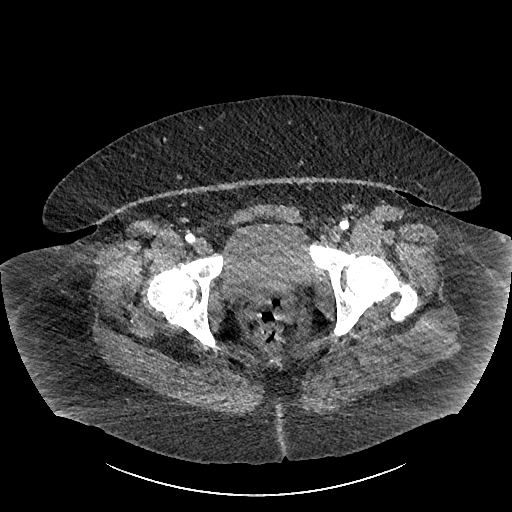
[im 73/344  soft-tissue]
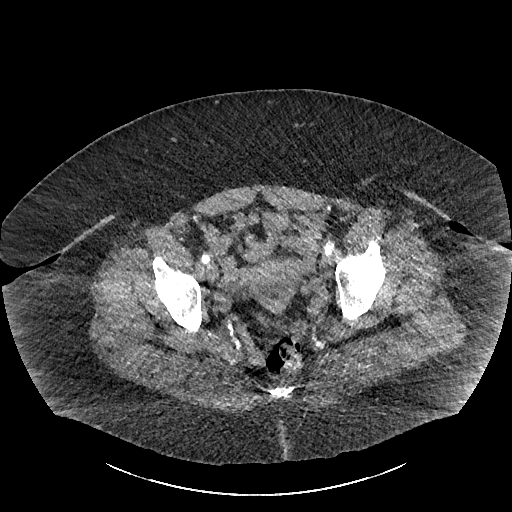
[im 109/344  soft-tissue]
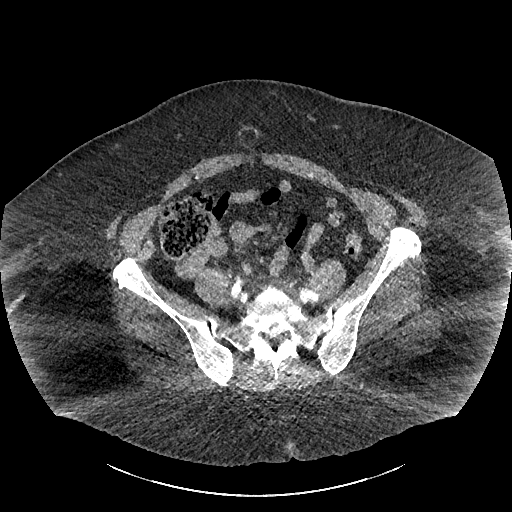
[im 127/344  soft-tissue]
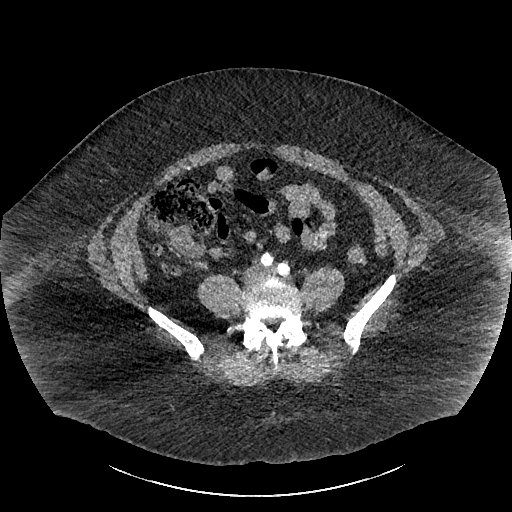
[im 163/344  soft-tissue]
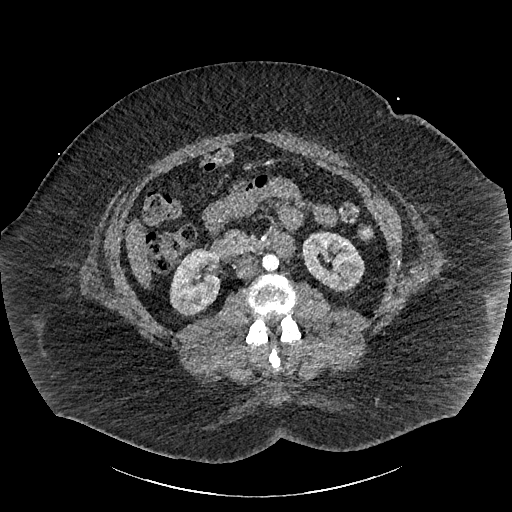
[im 181/344  soft-tissue]
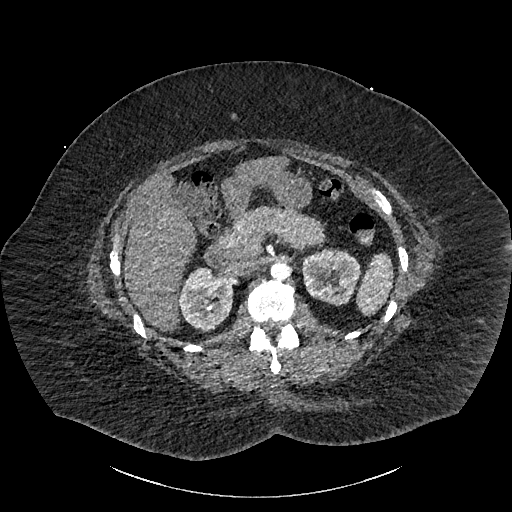
[im 217/344  soft-tissue]
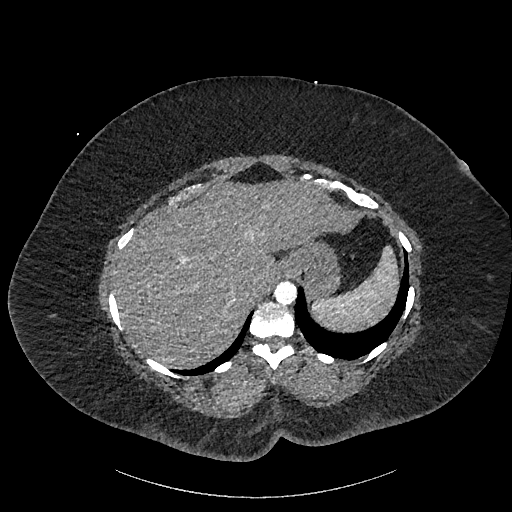
[im 235/344  soft-tissue]
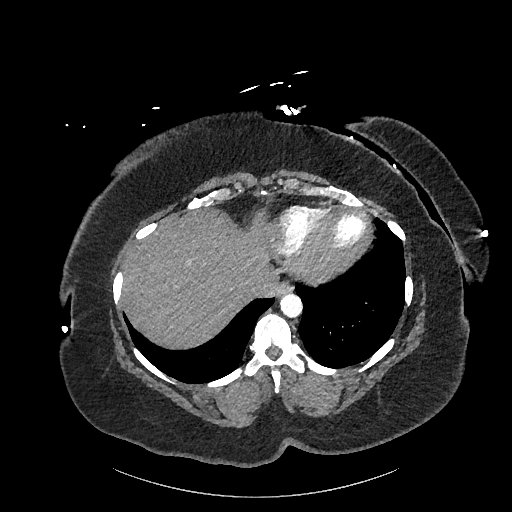
[im 235/344  bone]
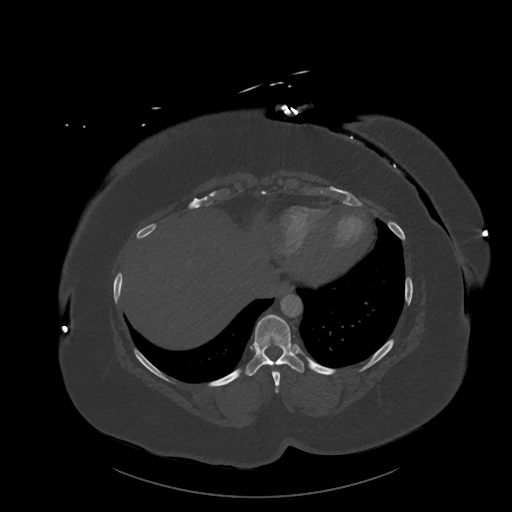
[im 271/344  soft-tissue]
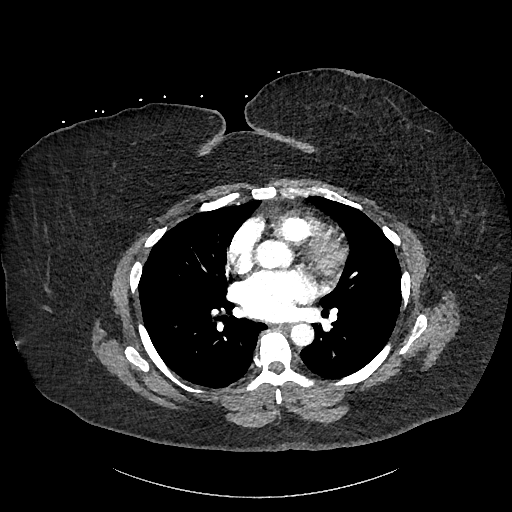
[im 289/344  soft-tissue]
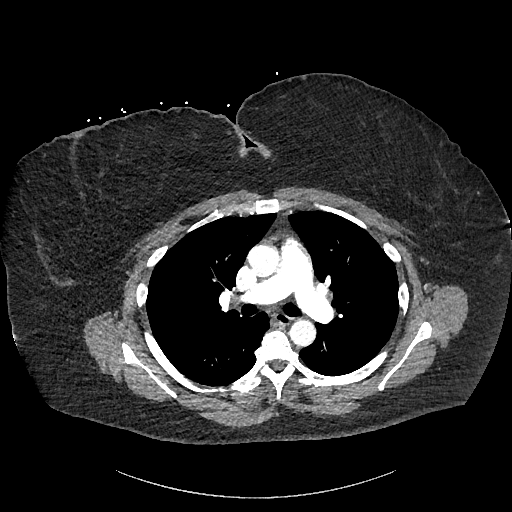
[im 325/344  soft-tissue]
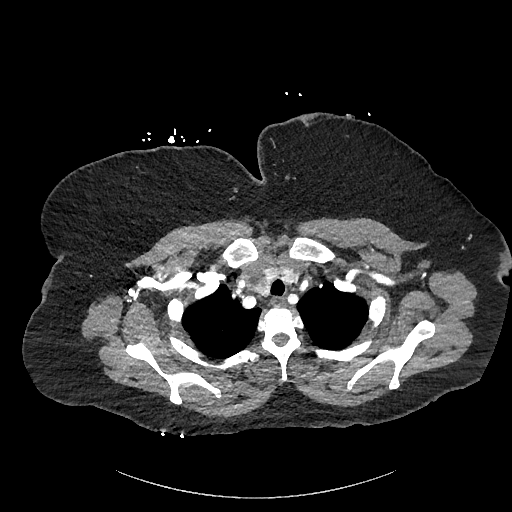

[Series 10: cor · coronal · 1.01mm/px · 3 of 168 slices shown]
[im 42/168  soft-tissue]
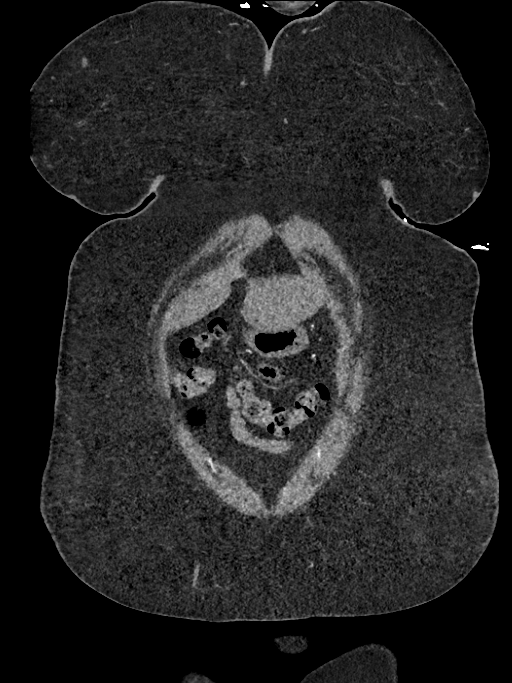
[im 84/168  soft-tissue]
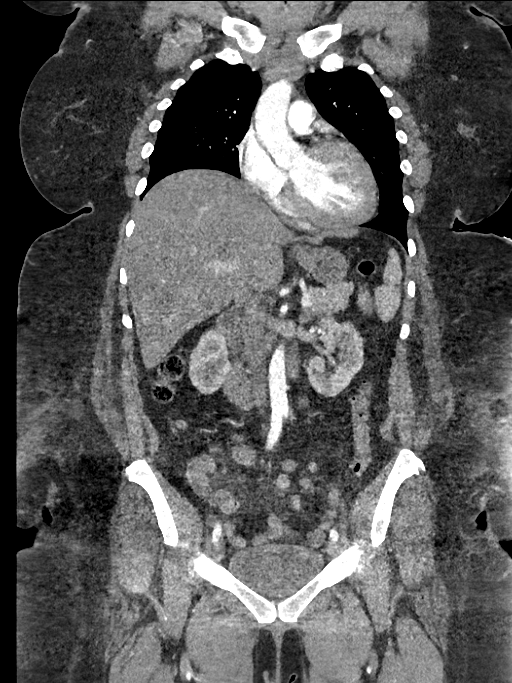
[im 126/168  soft-tissue]
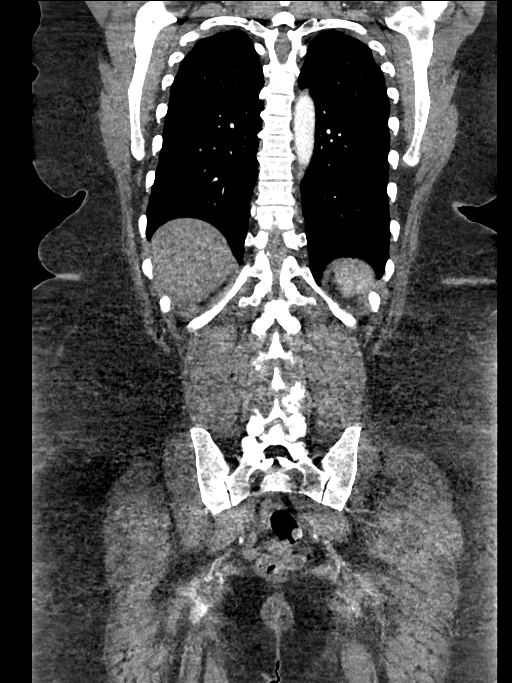

[15 of 46 positions shown; findings below may reference images not displayed]

FINDINGS: CTA CHEST FINDINGS

Cardiovascular: On unenhanced CT, there is no evidence of intramural
hematoma.

Preferential opacification of the thoracic aorta. No evidence of
thoracic aortic aneurysm or dissection.

The heart is normal in size.  No pericardial effusion.

No evidence of central pulmonary embolism.

Mediastinum/Nodes: No suspicious mediastinal lymphadenopathy.

Visualized thyroid is notable for a dominant 2.1 cm right thyroid
nodule.

Lungs/Pleura: No suspicious pulmonary nodules.

Scattered lung cysts.

No focal consolidation.

No pleural effusion or pneumothorax.

Musculoskeletal: Degenerative changes of the thoracic spine.

Review of the MIP images confirms the above findings.

CTA ABDOMEN AND PELVIS FINDINGS

VASCULAR

Aorta: No evidence abdominal aortic aneurysm or dissection.  Patent.

Celiac: Patent.

SMA: Patent.

Renals: Patent bilaterally.

IMA: Poorly opacified but likely patent.

Inflow: Patent.

Veins: Unremarkable.

Review of the MIP images confirms the above findings.

NON-VASCULAR

Hepatobiliary: Liver is within normal limits.

Gallbladder is unremarkable. No intrahepatic or extrahepatic ductal
dilatation.

Pancreas: Within normal limits.

Spleen: Within normal limits.

Adrenals/Urinary Tract: Adrenal glands are within normal limits.

Kidneys are within normal limits.  No hydronephrosis.

Bladder is within normal limits.

Stomach/Bowel: Stomach is within normal limits.

No evidence of bowel obstruction.

Normal appendix (series 7/image 216).

Lymphatic: No suspicious abdominopelvic lymphadenopathy.

Reproductive: Uterus is within normal limits.

No adnexal masses.

Other: No abdominopelvic ascites.

Musculoskeletal: Mild degenerative changes of the lumbar spine.

Review of the MIP images confirms the above findings.
IMPRESSION: No evidence of thoracoabdominal aortic aneurysm or dissection.

No evidence of central pulmonary embolism.

No CT findings to account for the patient's chest/back pain.

## 2019-06-19 ENCOUNTER — Ambulatory Visit: Payer: 59 | Admitting: Internal Medicine

## 2019-09-10 ENCOUNTER — Encounter: Payer: Self-pay | Admitting: Gynecology
